# Patient Record
Sex: Male | Born: 1960 | Race: White | Hispanic: No | Marital: Married | Smoking: Current some day smoker
Health system: Southern US, Community
[De-identification: ages and names within clinical notes are randomized; demographics above are authoritative.]

## PROBLEM LIST (undated history)

## (undated) HISTORY — PX: HERNIA REPAIR: SHX51

## (undated) HISTORY — PX: WISDOM TOOTH EXTRACTION: SHX21

---

## 2013-06-06 HISTORY — PX: UMBILICAL HERNIA REPAIR: SHX196

## 2014-11-17 ENCOUNTER — Encounter: Payer: Self-pay | Admitting: Internal Medicine

## 2014-11-17 ENCOUNTER — Ambulatory Visit (INDEPENDENT_AMBULATORY_CARE_PROVIDER_SITE_OTHER): Payer: Federal, State, Local not specified - PPO | Admitting: Internal Medicine

## 2014-11-17 VITALS — BP 114/78 | HR 90 | Temp 98.6°F | Ht 67.33 in | Wt 185.0 lb

## 2014-11-17 DIAGNOSIS — Z79899 Other long term (current) drug therapy: Secondary | ICD-10-CM

## 2014-11-17 DIAGNOSIS — E785 Hyperlipidemia, unspecified: Secondary | ICD-10-CM | POA: Diagnosis not present

## 2014-11-17 DIAGNOSIS — Z0000 Encounter for general adult medical examination without abnormal findings: Secondary | ICD-10-CM

## 2014-11-17 DIAGNOSIS — Z125 Encounter for screening for malignant neoplasm of prostate: Secondary | ICD-10-CM

## 2014-11-17 DIAGNOSIS — Z1211 Encounter for screening for malignant neoplasm of colon: Secondary | ICD-10-CM

## 2014-11-17 NOTE — Patient Instructions (Signed)

## 2014-11-17 NOTE — Progress Notes (Signed)
HPI  Pt presents to the clinic today to establish care. He is transferring care from his PCP in [place]. His last annual exam was 3 years ago. He would like to get one today if he could.  Flu: never Tetanus: 2014 PSA Screening: never Colon Screening: never Vision Screening: as needed Dentist: biannually  Diet: He eats mostly whatever he wants to eat. Fruits and veggies. He does drink mostly water. Exercise:  He runs on the treadmill 3 days per week and goes for walks with his wife.  No past medical history on file.  No current outpatient prescriptions on file.   No current facility-administered medications for this visit.    No Known Allergies  Family History  Problem Relation Age of Onset  . Hyperlipidemia Father   . Heart disease Maternal Grandmother   . Heart disease Maternal Grandfather   . Heart disease Paternal Grandmother   . Hyperlipidemia Paternal Grandfather   . Heart disease Paternal Grandfather   . Stroke Paternal Grandfather   . Hypertension Paternal Grandfather   . Esophageal cancer Mother     History   Social History  . Marital Status: Married    Spouse Name: N/A  . Number of Children: N/A  . Years of Education: N/A   Occupational History  . Not on file.   Social History Main Topics  . Smoking status: Current Some Day Smoker    Types: Cigars  . Smokeless tobacco: Not on file  . Alcohol Use: 0.0 oz/week    0 Standard drinks or equivalent per week     Comment: socially  . Drug Use: Not on file  . Sexual Activity: Not on file   Other Topics Concern  . Not on file   Social History Narrative  . No narrative on file    ROS:  Constitutional: Denies fever, malaise, fatigue, headache or abrupt weight changes.  HEENT: Denies eye pain, eye redness, ear pain, ringing in the ears, wax buildup, runny nose, nasal congestion, bloody nose, or sore throat. Respiratory: Denies difficulty breathing, shortness of breath, cough or sputum production.    Cardiovascular: Denies chest pain, chest tightness, palpitations or swelling in the hands or feet.  Gastrointestinal: Denies abdominal pain, bloating, constipation, diarrhea or blood in the stool.  GU: Denies frequency, urgency, pain with urination, blood in urine, odor or discharge. Musculoskeletal: Denies decrease in range of motion, difficulty with gait, muscle pain or joint pain and swelling.  Skin: Denies redness, rashes, lesions or ulcercations.  Neurological: Denies dizziness, difficulty with memory, difficulty with speech or problems with balance and coordination.  Psych: Denies anxiety, depression, SI/HI.  No other specific complaints in a complete review of systems (except as listed in HPI above).  PE:  BP 114/78 mmHg  Pulse 90  Temp(Src) 98.6 F (37 C) (Oral)  Ht 5' 7.33" (1.71 m)  Wt 185 lb (83.915 kg)  BMI 28.70 kg/m2  SpO2 98% Wt Readings from Last 3 Encounters:  11/17/14 185 lb (83.915 kg)    General: Appears his stated age, well developed, well nourished in NAD. HEENT: Head: normal shape and size; Eyes: sclera white, no icterus, conjunctiva pink, PERRLA and EOMs intact; Ears: Tm's gray and intact, normal light reflex;  Throat/Mouth: Teeth present, mucosa pink and moist, no lesions or ulcerations noted.  Neck: Neck supple, trachea midline. No massses, lumps or thyromegaly present.  Cardiovascular: Normal rate and rhythm. S1,S2 noted.  No murmur, rubs or gallops noted. No JVD or BLE edema. No carotid  bruits noted. Pulmonary/Chest: Normal effort and positive vesicular breath sounds. No respiratory distress. No wheezes, rales or ronchi noted.  Abdomen: Soft and nontender. Normal bowel sounds, no bruits noted. No distention or masses noted. Liver, spleen and kidneys non palpable. Musculoskeletal: Normal range of motion. Strength 5/5 BUE/BLE. No difficulty with gait.  Neurological: Alert and oriented. Cranial nerves II-XII grossly intact. Coordination normal.  Psychiatric:  Mood and affect normal. Behavior is normal. Judgment and thought content normal.     Assessment and Plan:  Preventative Health Maintenance:  Encouraged him to get a flu shot in the fall Tetanus UTD Will check PSA in addition to CBC, CMET, Lipid profile ECG normal He declines colonoscopy but is agreeable to IFOB, ordered today Encouraged him to work on diet and exercise  RTC in 1 year or sooner if needed

## 2014-11-17 NOTE — Progress Notes (Signed)
Pre visit review using our clinic review tool, if applicable. No additional management support is needed unless otherwise documented below in the visit note. 

## 2014-11-18 LAB — COMPREHENSIVE METABOLIC PANEL
ALK PHOS: 66 U/L (ref 39–117)
ALT: 20 U/L (ref 0–53)
AST: 22 U/L (ref 0–37)
Albumin: 4.4 g/dL (ref 3.5–5.2)
BUN: 25 mg/dL — ABNORMAL HIGH (ref 6–23)
CHLORIDE: 101 meq/L (ref 96–112)
CO2: 32 meq/L (ref 19–32)
Calcium: 9.5 mg/dL (ref 8.4–10.5)
Creatinine, Ser: 1.21 mg/dL (ref 0.40–1.50)
GFR: 66.48 mL/min (ref >60.00)
Glucose, Bld: 101 mg/dL — ABNORMAL HIGH (ref 70–99)
Potassium: 4.4 mEq/L (ref 3.5–5.1)
SODIUM: 140 meq/L (ref 135–145)
TOTAL PROTEIN: 6.9 g/dL (ref 6.0–8.3)
Total Bilirubin: 0.5 mg/dL (ref 0.2–1.2)

## 2014-11-18 LAB — LIPID PANEL
Cholesterol: 256 mg/dL — ABNORMAL HIGH (ref 0–200)
HDL: 46 mg/dL (ref >39.00)
NonHDL: 210
Total CHOL/HDL Ratio: 6
Triglycerides: 396 mg/dL — ABNORMAL HIGH (ref 0.0–149.0)
VLDL: 79.2 mg/dL — AB (ref 0.0–40.0)

## 2014-11-18 LAB — CBC
HCT: 44.3 % (ref 39.0–52.0)
HEMOGLOBIN: 15.1 g/dL (ref 13.0–17.0)
MCHC: 34 g/dL (ref 30.0–36.0)
MCV: 87.9 fl (ref 78.0–100.0)
PLATELETS: 303 10*3/uL (ref 150.0–400.0)
RBC: 5.04 Mil/uL (ref 4.22–5.81)
RDW: 13.6 % (ref 11.5–15.5)
WBC: 9.2 10*3/uL (ref 4.0–10.5)

## 2014-11-18 LAB — PSA: PSA: 1.56 ng/mL (ref 0.10–4.00)

## 2014-11-18 LAB — LDL CHOLESTEROL, DIRECT: Direct LDL: 182 mg/dL

## 2014-11-18 MED ORDER — SIMVASTATIN 20 MG PO TABS: 20.0000 mg | ORAL_TABLET | Freq: Every day | ORAL | Status: DC

## 2014-11-18 NOTE — Addendum Note (Signed)
Addended by: Littie DeedsEVONTENNO, Phineas Mcenroe Y on: 11/18/2014 05:00 PM   Modules accepted: Orders

## 2015-06-09 LAB — HEPATIC FUNCTION PANEL
ALK PHOS: 68 U/L (ref 25–125)
ALT: 31 U/L (ref 10–40)
AST: 29 U/L (ref 14–40)

## 2015-06-09 LAB — LIPID PANEL
Cholesterol: 236 mg/dL — AB (ref 0–200)
HDL: 60 mg/dL (ref 35–70)
LDL CALC: 125 mg/dL
Triglycerides: 252 mg/dL — AB (ref 40–160)

## 2015-06-09 LAB — HEMOGLOBIN A1C: HEMOGLOBIN A1C: 6.1

## 2015-06-26 ENCOUNTER — Ambulatory Visit (INDEPENDENT_AMBULATORY_CARE_PROVIDER_SITE_OTHER): Payer: Federal, State, Local not specified - PPO | Admitting: Internal Medicine

## 2015-06-26 ENCOUNTER — Encounter: Payer: Self-pay | Admitting: Internal Medicine

## 2015-06-26 VITALS — BP 118/70 | HR 76 | Temp 98.2°F | Wt 192.0 lb

## 2015-06-26 DIAGNOSIS — R7303 Prediabetes: Secondary | ICD-10-CM | POA: Diagnosis not present

## 2015-06-26 DIAGNOSIS — E785 Hyperlipidemia, unspecified: Secondary | ICD-10-CM | POA: Diagnosis not present

## 2015-06-26 NOTE — Assessment & Plan Note (Signed)
Now on Paleo diet and walking 2.5 miles today Discussed how low carb diet and exercise reduces blood sugar levels I do not think he needs to be started on medication at this time Will check A1C around 09/04/15

## 2015-06-26 NOTE — Assessment & Plan Note (Signed)
Encouraged him to consume a low fat diet Continue Zocor at this time Will recheck Lipid Profile and CMET around 09/04/15

## 2015-06-26 NOTE — Progress Notes (Signed)
Subjective:    Patient ID: Justin Alvarado, male    DOB: 1961-02-25, 55 y.o.   MRN: [id number]  HPI  Pt presents to the clinic today to discuss labs that he had done by his insurance carrier. He reports he had an A1C of 6.1% from 06/09/15. Since he got those results, he has started on a low carb diet and walking 2.5 miles per day. He has no family history of diabetes and has never been told that he is prediabetic. He denies blurred vision, dizziness, increased thirst, frequent urination, or numbness/tingling in his hands and feet.  He is also past due for follow up of HLD. His last LDL was 182, Triglycerides 396 (11/2014). He was started on Zocor and advised to follow a strict low fat, low cholesterol diet. He reports he only started the Zocor 6 weeks ago. He has been working on a low fat diet.  Review of Systems  No past medical history on file.  Current Outpatient Prescriptions  Medication Sig Dispense Refill  . simvastatin (ZOCOR) 20 MG tablet Take 1 tablet (20 mg total) by mouth at bedtime. 30 tablet 2   No current facility-administered medications for this visit.    No Known Allergies  Family History  Problem Relation Age of Onset  . Hyperlipidemia Father   . Heart disease Maternal Grandmother   . Heart disease Maternal Grandfather   . Heart disease Paternal Grandmother   . Hyperlipidemia Paternal Grandfather   . Heart disease Paternal Grandfather   . Stroke Paternal Grandfather   . Hypertension Paternal Grandfather   . Esophageal cancer Mother     Social History   Social History  . Marital Status: Married    Spouse Name: N/A  . Number of Children: N/A  . Years of Education: N/A   Occupational History  . Not on file.   Social History Main Topics  . Smoking status: Current Some Day Smoker    Types: Cigars  . Smokeless tobacco: Not on file  . Alcohol Use: 0.0 oz/week    0 Standard drinks or equivalent per week     Comment: socially  . Drug Use: No  . Sexual  Activity: Yes   Other Topics Concern  . Not on file   Social History Narrative     Constitutional: Denies fever, malaise, fatigue, headache or abrupt weight changes.  HEENT: Denies eye pain, eye redness, ear pain, ringing in the ears, wax buildup, runny nose, nasal congestion, bloody nose, or sore throat. Respiratory: Denies difficulty breathing, shortness of breath, cough or sputum production.   Cardiovascular: Denies chest pain, chest tightness, palpitations or swelling in the hands or feet.  Gastrointestinal: Denies abdominal pain, bloating, constipation, diarrhea or blood in the stool.  GU: Denies urgency, frequency, pain with urination, burning sensation, blood in urine, odor or discharge. Musculoskeletal: Denies decrease in range of motion, difficulty with gait, muscle pain or joint pain and swelling.  Skin: Denies redness, rashes, lesions or ulcercations.  Neurological: Denies dizziness, difficulty with memory, difficulty with speech or problems with balance and coordination.  Psych: Denies anxiety, depression, SI/HI.  No other specific complaints in a complete review of systems (except as listed in HPI above).     Objective:   Physical Exam  BP 118/70 mmHg  Pulse 76  Temp(Src) 98.2 F (36.8 C) (Oral)  Wt 192 lb (87.091 kg)  SpO2 98% Wt Readings from Last 3 Encounters:  06/26/15 192 lb (87.091 kg)  11/17/14 185 lb (83.915 kg)  General: Appears his stated age, well developed, well nourished in NAD. Skin: Warm, dry and intact. No rashes, lesions or ulcerations noted. Cardiovascular: Normal rate and rhythm. S1,S2 noted.  No murmur, rubs or gallops noted. No JVD or BLE edema. No carotid bruits noted. Pulmonary/Chest: Normal effort and positive vesicular breath sounds. No respiratory distress. No wheezes, rales or ronchi noted.  Neurological: Alert and oriented.    BMET    Component Value Date/Time   NA 140 11/17/2014 1606   K 4.4 11/17/2014 1606   CL 101  11/17/2014 1606   CO2 32 11/17/2014 1606   GLUCOSE 101* 11/17/2014 1606   BUN 25* 11/17/2014 1606   CREATININE 1.21 11/17/2014 1606   CALCIUM 9.5 11/17/2014 1606    Lipid Panel     Component Value Date/Time   CHOL 256* 11/17/2014 1606   TRIG 396.0* 11/17/2014 1606   HDL 46.00 11/17/2014 1606   CHOLHDL 6 11/17/2014 1606   VLDL 79.2* 11/17/2014 1606    CBC    Component Value Date/Time   WBC 9.2 11/17/2014 1606   RBC 5.04 11/17/2014 1606   HGB 15.1 11/17/2014 1606   HCT 44.3 11/17/2014 1606   PLT 303.0 11/17/2014 1606   MCV 87.9 11/17/2014 1606   MCHC 34.0 11/17/2014 1606   RDW 13.6 11/17/2014 1606    Hgb A1C No results found for: HGBA1C       Assessment & Plan:

## 2015-06-26 NOTE — Progress Notes (Signed)
Pre visit review using our clinic review tool, if applicable. No additional management support is needed unless otherwise documented below in the visit note. 

## 2015-06-26 NOTE — Patient Instructions (Signed)

## 2015-09-04 ENCOUNTER — Other Ambulatory Visit: Payer: Federal, State, Local not specified - PPO

## 2015-09-06 ENCOUNTER — Encounter: Payer: Self-pay | Admitting: Internal Medicine

## 2015-09-08 ENCOUNTER — Other Ambulatory Visit (INDEPENDENT_AMBULATORY_CARE_PROVIDER_SITE_OTHER): Payer: Federal, State, Local not specified - PPO

## 2015-09-08 DIAGNOSIS — R7303 Prediabetes: Secondary | ICD-10-CM

## 2015-09-08 DIAGNOSIS — E785 Hyperlipidemia, unspecified: Secondary | ICD-10-CM | POA: Diagnosis not present

## 2015-09-08 DIAGNOSIS — Z79899 Other long term (current) drug therapy: Secondary | ICD-10-CM | POA: Diagnosis not present

## 2015-09-08 LAB — COMPREHENSIVE METABOLIC PANEL
ALBUMIN: 4.7 g/dL (ref 3.5–5.2)
ALT: 29 U/L (ref 0–53)
AST: 53 U/L — AB (ref 0–37)
Alkaline Phosphatase: 60 U/L (ref 39–117)
BUN: 20 mg/dL (ref 6–23)
CALCIUM: 9.9 mg/dL (ref 8.4–10.5)
CHLORIDE: 99 meq/L (ref 96–112)
CO2: 30 meq/L (ref 19–32)
Creatinine, Ser: 1.2 mg/dL (ref 0.40–1.50)
GFR: 66.92 mL/min (ref >60.00)
Glucose, Bld: 98 mg/dL (ref 70–99)
Potassium: 4.3 mEq/L (ref 3.5–5.1)
Sodium: 134 mEq/L — ABNORMAL LOW (ref 135–145)
Total Bilirubin: 0.8 mg/dL (ref 0.2–1.2)
Total Protein: 7.6 g/dL (ref 6.0–8.3)

## 2015-09-08 LAB — LIPID PANEL
CHOL/HDL RATIO: 6
CHOLESTEROL: 252 mg/dL — AB (ref 0–200)
HDL: 45.8 mg/dL (ref >39.00)
LDL CALC: 185 mg/dL — AB (ref 0–99)
NonHDL: 206.36
TRIGLYCERIDES: 108 mg/dL (ref 0.0–149.0)
VLDL: 21.6 mg/dL (ref 0.0–40.0)

## 2015-09-08 LAB — HEMOGLOBIN A1C: HEMOGLOBIN A1C: 6.1 % (ref 4.6–6.5)

## 2015-09-13 MED ORDER — SIMVASTATIN 40 MG PO TABS: 40.0000 mg | ORAL_TABLET | Freq: Every day | ORAL | Status: DC

## 2015-09-13 NOTE — Addendum Note (Signed)
Addended by: Roena MaladyEVONTENNO, MELANIE Y on: 09/13/2015 12:18 PM   Modules accepted: Orders, Medications

## 2016-08-27 ENCOUNTER — Ambulatory Visit: Payer: Federal, State, Local not specified - PPO | Admitting: Internal Medicine

## 2016-09-02 ENCOUNTER — Encounter: Payer: Self-pay | Admitting: Internal Medicine

## 2016-09-02 ENCOUNTER — Ambulatory Visit (INDEPENDENT_AMBULATORY_CARE_PROVIDER_SITE_OTHER): Payer: Federal, State, Local not specified - PPO | Admitting: Internal Medicine

## 2016-09-02 VITALS — BP 120/80 | HR 84 | Temp 98.6°F | Wt 188.0 lb

## 2016-09-02 DIAGNOSIS — J069 Acute upper respiratory infection, unspecified: Secondary | ICD-10-CM

## 2016-09-02 MED ORDER — HYDROCODONE-HOMATROPINE 5-1.5 MG/5ML PO SYRP: 5.0000 mL | ORAL_SOLUTION | Freq: Three times a day (TID) | ORAL | 0 refills | Status: DC | PRN

## 2016-09-02 NOTE — Patient Instructions (Signed)
Cough, Adult  A cough helps to clear your throat and lungs. A cough may last only 2?3 weeks (acute), or it may last longer than 8 weeks (chronic). Many different things can cause a cough. A cough may be a sign of an illness or another medical condition.  Follow these instructions at home:  ? Pay attention to any changes in your cough.  ? Take medicines only as told by your doctor.  ? If you were prescribed an antibiotic medicine, take it as told by your doctor. Do not stop taking it even if you start to feel better.  ? Talk with your doctor before you try using a cough medicine.  ? Drink enough fluid to keep your pee (urine) clear or pale yellow.  ? If the air is dry, use a cold steam vaporizer or humidifier in your home.  ? Stay away from things that make you cough at work or at home.  ? If your cough is worse at night, try using extra pillows to raise your head up higher while you sleep.  ? Do not smoke, and try not to be around smoke. If you need help quitting, ask your doctor.  ? Do not have caffeine.  ? Do not drink alcohol.  ? Rest as needed.  Contact a doctor if:  ? You have new problems (symptoms).  ? You cough up yellow fluid (pus).  ? Your cough does not get better after 2?3 weeks, or your cough gets worse.  ? Medicine does not help your cough and you are not sleeping well.  ? You have pain that gets worse or pain that is not helped with medicine.  ? You have a fever.  ? You are losing weight and you do not know why.  ? You have night sweats.  Get help right away if:  ? You cough up blood.  ? You have trouble breathing.  ? Your heartbeat is very fast.  This information is not intended to replace advice given to you by your health care provider. Make sure you discuss any questions you have with your health care provider.  Document Released: 01/03/2011 Document Revised: 09/28/2015 Document Reviewed: 06/29/2014  Elsevier Interactive Patient Education ? 2017 [employer]

## 2016-09-02 NOTE — Progress Notes (Signed)
Subjective:    Patient ID: Justin Alvarado, male    DOB: 07-18-1960, 56 y.o.   MRN: [id number]  HPI  Pt presents to the clinic today for UC followup. This started 1 week ago. He went to [facility] on 08/26/2016. He was diagnosed with a URI and given Azithromycin, Albuterol, Tessalon and Flnase. At this point, he has finished the Azithromycin. He continues to have runny nose, nasal congestion, cough. He is blowing clear mucous out of his nose. The cough is nonproductive. He denies fever, chills or body aches. He has not taken anything other than what he was prescribed. He has had sick contacts.   Review of Systems  No past medical history on file.  Current Outpatient Prescriptions  Medication Sig Dispense Refill  . simvastatin (ZOCOR) 40 MG tablet Take 1 tablet (40 mg total) by mouth at bedtime. 30 tablet 5   No current facility-administered medications for this visit.     No Known Allergies  Family History  Problem Relation Age of Onset  . Hyperlipidemia Father   . Heart disease Maternal Grandmother   . Heart disease Maternal Grandfather   . Heart disease Paternal Grandmother   . Hyperlipidemia Paternal Grandfather   . Heart disease Paternal Grandfather   . Stroke Paternal Grandfather   . Hypertension Paternal Grandfather   . Esophageal cancer Mother     Social History   Social History  . Marital status: Married    Spouse name: N/A  . Number of children: N/A  . Years of education: N/A   Occupational History  . Not on file.   Social History Main Topics  . Smoking status: Current Some Day Smoker    Types: Cigars  . Smokeless tobacco: Never Used  . Alcohol use 0.0 oz/week     Comment: socially  . Drug use: No  . Sexual activity: Yes   Other Topics Concern  . Not on file   Social History Narrative  . No narrative on file     Constitutional: Denies fever, malaise, fatigue, headache or abrupt weight changes.  HEENT: Pt reports runny nose, nasal  congestion. Denies eye pain, eye redness, ear pain, ringing in the ears, wax buildup, bloody nose, or sore throat. Respiratory: Pt reports cough. Denies difficulty breathing, shortness of breath, or sputum production.   Cardiovascular: Denies chest pain, chest tightness, palpitations or swelling in the hands or feet.   No other specific complaints in a complete review of systems (except as listed in HPI above).     Objective:   Physical Exam  BP 120/80   Pulse 84   Temp 98.6 F (37 C) (Oral)   Wt 188 lb (85.3 kg)   SpO2 97%   BMI 29.16 kg/m  Wt Readings from Last 3 Encounters:  09/02/16 188 lb (85.3 kg)  06/26/15 192 lb (87.1 kg)  11/17/14 185 lb (83.9 kg)    General: Appears his stated age, well developed, well nourished in NAD. HEENT: Head: normal shape and size, no sinus tenderness noted;  Ears: Tm's gray and intact, normal light reflex; Nose: mucosa pink and moist, septum midline; Throat/Mouth: Teeth present, mucosa pink and moist, +PND, no exudate, lesions or ulcerations noted.  Neck:  No adenopathy noted. Cardiovascular: Normal rate and rhythm.  Pulmonary/Chest: Normal effort and positive vesicular breath sounds. No respiratory distress. No wheezes, rales or ronchi noted.    BMET    Component Value Date/Time   NA 134 (L) 09/08/2015 0813   K  4.3 09/08/2015 0813   CL 99 09/08/2015 0813   CO2 30 09/08/2015 0813   GLUCOSE 98 09/08/2015 0813   BUN 20 09/08/2015 0813   CREATININE 1.20 09/08/2015 0813   CALCIUM 9.9 09/08/2015 0813    Lipid Panel     Component Value Date/Time   CHOL 252 (H) 09/08/2015 0813   TRIG 108.0 09/08/2015 0813   HDL 45.80 09/08/2015 0813   CHOLHDL 6 09/08/2015 0813   VLDL 21.6 09/08/2015 0813   LDLCALC 185 (H) 09/08/2015 0813    CBC    Component Value Date/Time   WBC 9.2 11/17/2014 1606   RBC 5.04 11/17/2014 1606   HGB 15.1 11/17/2014 1606   HCT 44.3 11/17/2014 1606   PLT 303.0 11/17/2014 1606   MCV 87.9 11/17/2014 1606   MCHC  34.0 11/17/2014 1606   RDW 13.6 11/17/2014 1606    Hgb A1C Lab Results  Component Value Date   HGBA1C 6.1 09/08/2015            Assessment & Plan:   Cough:  No indication for repeat abx Keep your mouth moist, drink plenty of fluids RX for Hycodan for cough  Return precautions discussed Nicki Reaper, NP

## 2017-05-29 ENCOUNTER — Observation Stay: Payer: Federal, State, Local not specified - PPO

## 2017-05-29 ENCOUNTER — Emergency Department: Payer: Federal, State, Local not specified - PPO

## 2017-05-29 ENCOUNTER — Observation Stay
Admission: EM | Admit: 2017-05-29 | Discharge: 2017-05-30 | Disposition: A | Discharge status: Home / Self Care | Payer: Federal, State, Local not specified - PPO | Attending: Internal Medicine | Admitting: Internal Medicine

## 2017-05-29 ENCOUNTER — Encounter: Payer: Self-pay | Admitting: Emergency Medicine

## 2017-05-29 ENCOUNTER — Other Ambulatory Visit: Payer: Self-pay

## 2017-05-29 ENCOUNTER — Observation Stay (HOSPITAL_BASED_OUTPATIENT_CLINIC_OR_DEPARTMENT_OTHER)
Admit: 2017-05-29 | Discharge: 2017-05-29 | Disposition: A | Discharge status: Home / Self Care | Payer: Federal, State, Local not specified - PPO | Attending: Internal Medicine | Admitting: Internal Medicine

## 2017-05-29 DIAGNOSIS — F17290 Nicotine dependence, other tobacco product, uncomplicated: Secondary | ICD-10-CM | POA: Diagnosis not present

## 2017-05-29 DIAGNOSIS — Z9114 Patient's other noncompliance with medication regimen: Secondary | ICD-10-CM | POA: Insufficient documentation

## 2017-05-29 DIAGNOSIS — E785 Hyperlipidemia, unspecified: Secondary | ICD-10-CM | POA: Insufficient documentation

## 2017-05-29 DIAGNOSIS — G459 Transient cerebral ischemic attack, unspecified: Principal | ICD-10-CM | POA: Insufficient documentation

## 2017-05-29 DIAGNOSIS — Z79899 Other long term (current) drug therapy: Secondary | ICD-10-CM | POA: Insufficient documentation

## 2017-05-29 DIAGNOSIS — I5030 Unspecified diastolic (congestive) heart failure: Secondary | ICD-10-CM | POA: Diagnosis not present

## 2017-05-29 LAB — CBC
HCT: 49 % (ref 40.0–52.0)
Hemoglobin: 16.7 g/dL (ref 13.0–18.0)
MCH: 29.6 pg (ref 26.0–34.0)
MCHC: 34 g/dL (ref 32.0–36.0)
MCV: 87.1 fL (ref 80.0–100.0)
PLATELETS: 320 10*3/uL (ref 150–440)
RBC: 5.63 MIL/uL (ref 4.40–5.90)
RDW: 13.3 % (ref 11.5–14.5)
WBC: 6 10*3/uL (ref 3.8–10.6)

## 2017-05-29 LAB — APTT: aPTT: 38 seconds — ABNORMAL HIGH (ref 24–36)

## 2017-05-29 LAB — LIPID PANEL
CHOL/HDL RATIO: 5.2 ratio
Cholesterol: 306 mg/dL — ABNORMAL HIGH (ref 0–200)
HDL: 59 mg/dL (ref >40)
LDL CALC: 221 mg/dL — AB (ref 0–99)
Triglycerides: 129 mg/dL (ref <150)
VLDL: 26 mg/dL (ref 0–40)

## 2017-05-29 LAB — DIFFERENTIAL
Basophils Absolute: 0.1 10*3/uL (ref 0–0.1)
Basophils Relative: 1 %
Eosinophils Absolute: 0 10*3/uL (ref 0–0.7)
Eosinophils Relative: 1 %
LYMPHS PCT: 28 %
Lymphs Abs: 1.7 10*3/uL (ref 1.0–3.6)
MONO ABS: 0.5 10*3/uL (ref 0.2–1.0)
Monocytes Relative: 9 %
NEUTROS ABS: 3.7 10*3/uL (ref 1.4–6.5)
Neutrophils Relative %: 61 %

## 2017-05-29 LAB — PROTIME-INR
INR: 0.92
PROTHROMBIN TIME: 12.3 s (ref 11.4–15.2)

## 2017-05-29 LAB — ECHOCARDIOGRAM COMPLETE
Height: 69 in
Weight: 3097 oz

## 2017-05-29 LAB — COMPREHENSIVE METABOLIC PANEL
ALBUMIN: 4.7 g/dL (ref 3.5–5.0)
ALK PHOS: 65 U/L (ref 38–126)
ALT: 30 U/L (ref 17–63)
AST: 35 U/L (ref 15–41)
Anion gap: 8 (ref 5–15)
BUN: 17 mg/dL (ref 6–20)
CALCIUM: 9.5 mg/dL (ref 8.9–10.3)
CO2: 25 mmol/L (ref 22–32)
CREATININE: 1.05 mg/dL (ref 0.61–1.24)
Chloride: 102 mmol/L (ref 101–111)
GFR calc non Af Amer: >60 mL/min (ref >60)
GLUCOSE: 127 mg/dL — AB (ref 65–99)
Potassium: 3.9 mmol/L (ref 3.5–5.1)
SODIUM: 135 mmol/L (ref 135–145)
Total Bilirubin: 0.8 mg/dL (ref 0.3–1.2)
Total Protein: 8.3 g/dL — ABNORMAL HIGH (ref 6.5–8.1)

## 2017-05-29 LAB — TROPONIN I: Troponin I: <0.03 ng/mL (ref <0.03)

## 2017-05-29 MED ORDER — ASPIRIN 300 MG RE SUPP: 300.0000 mg | Freq: Every day | RECTAL | Status: DC

## 2017-05-29 MED ORDER — ACETAMINOPHEN 325 MG PO TABS
650.0000 mg | ORAL_TABLET | ORAL | Status: DC | PRN
  Administered 2017-05-30: 08:00:00 650 mg via ORAL
  Filled 2017-05-29: qty 2

## 2017-05-29 MED ORDER — ENOXAPARIN SODIUM 40 MG/0.4ML ~~LOC~~ SOLN
40.0000 mg | SUBCUTANEOUS | Status: DC
Start: 2017-05-29 — End: 2017-05-30
  Administered 2017-05-29: 22:00:00 40 mg via SUBCUTANEOUS
  Filled 2017-05-29: qty 0.4

## 2017-05-29 MED ORDER — ASPIRIN 81 MG PO CHEW
324.0000 mg | CHEWABLE_TABLET | Freq: Once | ORAL | Status: AC
  Administered 2017-05-29: 324 mg via ORAL
  Filled 2017-05-29: qty 4

## 2017-05-29 MED ORDER — STROKE: EARLY STAGES OF RECOVERY BOOK
Freq: Once | Status: AC
  Administered 2017-05-29: 13:00:00

## 2017-05-29 MED ORDER — ATORVASTATIN CALCIUM 20 MG PO TABS
40.0000 mg | ORAL_TABLET | Freq: Every day | ORAL | Status: DC
  Administered 2017-05-29: 40 mg via ORAL
  Filled 2017-05-29: qty 2

## 2017-05-29 MED ORDER — ACETAMINOPHEN 160 MG/5ML PO SOLN
650.0000 mg | ORAL | Status: DC | PRN
  Filled 2017-05-29: qty 20.3

## 2017-05-29 MED ORDER — ACETAMINOPHEN 650 MG RE SUPP: 650.0000 mg | RECTAL | Status: DC | PRN

## 2017-05-29 MED ORDER — ASPIRIN 325 MG PO TABS
325.0000 mg | ORAL_TABLET | Freq: Every day | ORAL | Status: DC
  Filled 2017-05-29: qty 1

## 2017-05-29 MED ORDER — SODIUM CHLORIDE 0.9 % IV SOLN
INTRAVENOUS | Status: DC
  Administered 2017-05-29 – 2017-05-30 (×2): via INTRAVENOUS

## 2017-05-29 NOTE — ED Notes (Signed)
Right arm numbness and tingling that began 25 minutes ago, pt said he began having slurred speech also that has resolved at this time.

## 2017-05-29 NOTE — Progress Notes (Signed)
OT Cancellation Note  Patient Details Name: Justin Alvarado MRN: [id number] DOB: 1961/03/02   Cancelled Treatment:    Reason Eval/Treat Not Completed: Patient at procedure or test/ unavailable. Order received, chart reviewed. Pt out of room for testing. Will re-attempt OT evaluation at later date/time as pt is available and medically appropriate.  Richrd PrimeJamie Stiller, MPH, MS, OTR/L ascom [phone] 05/29/17, 11:36 AM

## 2017-05-29 NOTE — ED Notes (Signed)
Family at bedside. 

## 2017-05-29 NOTE — Progress Notes (Signed)
OT Cancellation Note  Patient Details Name: Justin Alvarado MRN: [id number] DOB: [birth year]   Cancelled Treatment:    Reason Eval/Treat Not Completed: OT screened, no needs identified, will sign off. Order received, chart reviewed. Met with pt, spouse present. Pt verbalizes "feeling back to normal" with no complaints and eager to learn from the doctor what caused his previous symptoms. 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 Pt screened by OT and does not present with any physical or cognitive deficits at this time and is at baseline independence with ADL and mobility tasks. No skilled OT needs at this time. Will sign off. Please re-consult if there is a change in pt status as medically appropriate.   Jeni Salles, MPH, MS, OTR/L ascom [phone] 05/29/17, 3:07 PM

## 2017-05-29 NOTE — Progress Notes (Signed)
CH consulted with patient to follow-up per request of CH on call. Patient was with medical team member preparing to walk around the unit. Patient stated that he did not know why he was here, he is still waiting to get results. CH provided emotional support, empathetic listening, and silent prayer. CH left upon patient getting a phone call.

## 2017-05-29 NOTE — ED Provider Notes (Signed)
[facility] Emergency Department Provider Note   ____________________________________________   First MD Initiated Contact with Patient 05/29/17 [id number]     (approximate)  I have reviewed the triage vital signs and the nursing notes.   HISTORY  Chief Complaint Code Stroke; Numbness; and Aphasia    HPI Justin Alvarado is a 57 y.o. male reports no major medical history except for high cholesterol Patient was in the bathroom when he began and noticed that his right hand felt tingly.  He then dropped the toothbrush, began developing weakness in the right hand.  Wife reports that he came out of the bathroom looked pale and his speech was abnormal.  He wanted to sit for a little bit, they waited a few minutes but then drove to the hospital.  He reports no history of any significant medical disease except for high cholesterol.  Wife reports she does not believe he is taking any medications presently.  Patient in no pain.  No nausea or vomiting.  No headache.  Symptom onset was abrupt, wife estimates started about 830 this morning.  No chest pain.  No trouble breathing.  All of his symptoms were relieved about the same time he went to his CT scan.  He is now able to talk well, the numbness and weakness in his right arm is gone away.  He feels much better.  Denies any ongoing numbness tingling weakness trouble speaking or other symptoms at this time  History reviewed. No pertinent past medical history.  Patient Active Problem List   Diagnosis Date Noted  . TIA (transient ischemic attack) 05/29/2017  . HLD (hyperlipidemia) 06/26/2015  . Prediabetes 06/26/2015    Past Surgical History:  Procedure Laterality Date  . HERNIA REPAIR    . UMBILICAL HERNIA REPAIR  06/2013  . WISDOM TOOTH EXTRACTION      Prior to Admission medications   Medication Sig Start Date End Date Taking? Authorizing Provider  HYDROcodone-homatropine (HYCODAN) 5-1.5 MG/5ML syrup Take 5 mLs by  mouth every 8 (eight) hours as needed for cough. Patient not taking: Reported on 05/29/2017 09/02/16   Lorre Munroe, NP  simvastatin (ZOCOR) 40 MG tablet Take 1 tablet (40 mg total) by mouth at bedtime. Patient not taking: Reported on 05/29/2017 09/13/15   Lorre Munroe, NP    Allergies Patient has no known allergies.  Family History  Problem Relation Age of Onset  . Hyperlipidemia Father   . Esophageal cancer Mother   . Heart disease Maternal Grandmother   . Heart disease Maternal Grandfather   . Heart disease Paternal Grandmother   . Hyperlipidemia Paternal Grandfather   . Heart disease Paternal Grandfather   . Stroke Paternal Grandfather   . Hypertension Paternal Grandfather     Social History Social History   Tobacco Use  . Smoking status: Current Some Day Smoker    Types: Cigars  . Smokeless tobacco: Never Used  Substance Use Topics  . Alcohol use: Yes    Alcohol/week: 0.0 oz    Comment: socially  . Drug use: No    Review of Systems Constitutional: No fever/chills Eyes: No visual changes. ENT: No sore throat.  No neck pain. Cardiovascular: Denies chest pain. Respiratory: Denies shortness of breath. Gastrointestinal: No abdominal pain.  No nausea, no vomiting.  No diarrhea.  No constipation. Genitourinary: Negative for dysuria. Musculoskeletal: Negative for back pain. Skin: Negative for rash. Neurological: Negative for headaches.    ____________________________________________   PHYSICAL EXAM:  VITAL SIGNS: ED  Triage Vitals [05/29/17 0908]  Enc Vitals Group     BP (!) 159/90     Pulse Rate 92     Resp 20     Temp 98.9 F (37.2 C)     Temp Source Oral     SpO2 97 %     Weight 190 lb (86.2 kg)     Height      Head Circumference      Peak Flow      Pain Score      Pain Loc      Pain Edu?      Excl. in GC?     Constitutional: Alert and oriented. Well appearing and in no acute distress. Eyes: Conjunctivae are normal. Head:  Atraumatic. Nose: No congestion/rhinnorhea. Mouth/Throat: Mucous membranes are moist. Neck: No stridor.   Cardiovascular: Normal rate, regular rhythm. Grossly normal heart sounds.  Good peripheral circulation. Respiratory: Normal respiratory effort.  No retractions. Lungs CTAB. Gastrointestinal: Soft and nontender. No distention. Musculoskeletal: No lower extremity tenderness nor edema. Neurologic:  Normal speech and language. No gross focal neurologic deficits are appreciated.   NIH score equals 0, performed by me at bedside. The patient has no pronator drift. The patient has normal cranial nerve exam. Extraocular movements are normal. Visual fields are normal. Patient has 5 out of 5 strength in all extremities. There is no numbness or gross, acute sensory abnormality in the extremities bilaterally. No speech disturbance. No dysarthria. No aphasia. No ataxia. Normal finger nose finger bilat. Patient speaking in full and clear sentences.   Skin:  Skin is warm, dry and intact. No rash noted. Psychiatric: Mood and affect are normal. Speech and behavior are normal.  ____________________________________________   LABS (all labs ordered are listed, but only abnormal results are displayed)  Labs Reviewed  APTT - Abnormal; Notable for the following components:      Result Value   aPTT 38 (*)    All other components within normal limits  COMPREHENSIVE METABOLIC PANEL - Abnormal; Notable for the following components:   Glucose, Bld 127 (*)    Total Protein 8.3 (*)    All other components within normal limits  LIPID PANEL - Abnormal; Notable for the following components:   Cholesterol 306 (*)    LDL Cholesterol 221 (*)    All other components within normal limits  PROTIME-INR  CBC  DIFFERENTIAL  TROPONIN I  HIV ANTIBODY (ROUTINE TESTING)  CBG MONITORING, ED   ____________________________________________  EKG  Reviewed and are by me at 9:30 AM Heart rate 95 Cures  100 QTC 430 Normal sinus rhythm, some slight artifact, no evidence of ischemia or ectopy ____________________________________________  RADIOLOGY  CT of the brain reviewed by me, no acute hemorrhage  ____________________________________________   PROCEDURES  Procedure(s) performed: None  Procedures  Critical Care performed: Yes, see critical care note(s)  CRITICAL CARE Performed by: Sharyn Creamer   Total critical care time: 38 minutes  Critical care time was exclusive of separately billable procedures and treating other patients.  Critical care was necessary to treat or prevent imminent or life-threatening deterioration.  Critical care was time spent personally by me on the following activities: development of treatment plan with patient and/or surrogate as well as nursing, discussions with consultants, evaluation of patient's response to treatment, examination of patient, obtaining history from patient or surrogate, ordering and performing treatments and interventions, ordering and review of laboratory studies, ordering and review of radiographic studies, pulse oximetry and re-evaluation of patient's condition.  Acute code stroke, patient presented with acute neurologic symptoms.  ____________________________________________   INITIAL IMPRESSION / ASSESSMENT AND PLAN / ED COURSE  Pertinent labs & imaging results that were available during my care of the patient were reviewed by me and considered in my medical decision making (see chart for details).  Saw and evaluated the patient and soon as he arrived to the room.  His symptoms are now resolved, and his NIH score is 0.  However his symptoms proceeding sound very concerning for an acute neurologic event.  Code stroke was activated, Dr. neurologist evaluated the patient in the ER in conjunction with me.  Clinical Course as of May 30 1647  Thu May 29, 2017  0919 Awaiting patient arrival to room from CT.   [MQ]  [phone] Patient  with patient reported that he had a brief episode of tingling on his left side now, it is again resolved.  Currently awake alert with no deficits.  Being admitted to the hospitalist service.  Cannot really well explained why felt some to the left side at this time, but they are now resolved and is being admitted for further neuro workup.  Continue to monitor regular  [MQ]    Clinical Course User Index [MQ] Sharyn CreamerQuale, Vonya Ohalloran, MD   ----------------------------------------- 9:33 AM on 05/29/2017 ----------------------------------------- Spoke with Dr. Thad Rangereynolds, she advises patient is not a TPA candidate as symptoms resolved.  I would agree.  We will continue to monitor him closely in the emergency room, at this point Dr. Thad Rangereynolds recommends giving oral aspirin and admission to hospital.  I am in agreement.  No cardiopulmonary symptoms.  Hemodynamics are stable.  No signs or symptoms of aneurysm.  No symptoms of seizure.  Blood glucose normal.  ----------------------------------------- 9:38 AM on 05/29/2017 -----------------------------------------  Labs pending, but discussed with Dr. waiting the hospitalist, will admit the patient for further evaluation for what appears primary impression to be TIA at this time.   ____________________________________________   FINAL CLINICAL IMPRESSION(S) / ED DIAGNOSES  Final diagnoses:  TIA (transient ischemic attack)      NEW MEDICATIONS STARTED DURING THIS VISIT:  Current Discharge Medication List       Note:  This document was prepared using Dragon voice recognition software and may include unintentional dictation errors.     Sharyn CreamerQuale, Excell Neyland, MD 05/29/17 1650

## 2017-05-29 NOTE — H&P (Signed)
[facility] [place] [facility]   PATIENT NAME: Justin Alvarado Pokorski    MR#:  [id number]  DATE OF BIRTH:  03/26/1961  DATE OF ADMISSION:  05/29/2017  PRIMARY CARE PHYSICIAN: Lorre MunroeBaity, Regina W, NP   REQUESTING/REFERRING PHYSICIAN: dr Fanny Bienquale  CHIEF COMPLAINT:   Numbness right hand HISTORY OF PRESENT ILLNESS:  Justin Alvarado  is a 57 y.o. male with no past medical history presented to the emergency room due to numbness in the right hand and weakness. Patient reports that early this morning he dropped his toothbrush and developed right-sided weakness. Wife reports that he came out of the bathroom his speech was abnormal. She noticed some slurred speech No other focal deficits were noted. Patient without facial droop, headache, chest pain or shortness breath.  He also states that this morning when he was getting CT head he had numbness of his left side of the face. All of the symptoms have subsided. PAST MEDICAL HISTORY:  None  PAST SURGICAL HISTORY:   Past Surgical History:  Procedure Laterality Date  . HERNIA REPAIR    . UMBILICAL HERNIA REPAIR  06/2013  . WISDOM TOOTH EXTRACTION      SOCIAL HISTORY:   Social History   Tobacco Use  . Smoking status: Current Some Day Smoker    Types: Cigars  . Smokeless tobacco: Never Used  Substance Use Topics  . Alcohol use: Yes    Alcohol/week: 0.0 oz    Comment: socially    FAMILY HISTORY:   Family History  Problem Relation Age of Onset  . Hyperlipidemia Father   . Esophageal cancer Mother   . Heart disease Maternal Grandmother   . Heart disease Maternal Grandfather   . Heart disease Paternal Grandmother   . Hyperlipidemia Paternal Grandfather   . Heart disease Paternal Grandfather   . Stroke Paternal Grandfather   . Hypertension Paternal Grandfather     DRUG ALLERGIES:  No Known Allergies  REVIEW OF SYSTEMS:   Review of Systems  Constitutional: Negative.  Negative for chills, fever and  malaise/fatigue.  HENT: Negative.  Negative for ear discharge, ear pain, hearing loss, nosebleeds and sore throat.   Eyes: Negative.  Negative for blurred vision and pain.  Respiratory: Negative.  Negative for cough, hemoptysis, shortness of breath and wheezing.   Cardiovascular: Negative.  Negative for chest pain, palpitations and leg swelling.  Gastrointestinal: Negative.  Negative for abdominal pain, blood in stool, diarrhea, nausea and vomiting.  Genitourinary: Negative.  Negative for dysuria.  Musculoskeletal: Negative.  Negative for back pain.  Skin: Negative.   Neurological: Positive for tingling, sensory change, speech change and focal weakness. Negative for dizziness, tremors, seizures and headaches.  Endo/Heme/Allergies: Negative.  Does not bruise/bleed easily.  Psychiatric/Behavioral: Negative.  Negative for depression, hallucinations and suicidal ideas.    MEDICATIONS AT HOME:   Prior to Admission medications   Medication Sig Start Date End Date Taking? Authorizing Provider  HYDROcodone-homatropine (HYCODAN) 5-1.5 MG/5ML syrup Take 5 mLs by mouth every 8 (eight) hours as needed for cough. Patient not taking: Reported on 05/29/2017 09/02/16   Lorre MunroeBaity, Regina W, NP  simvastatin (ZOCOR) 40 MG tablet Take 1 tablet (40 mg total) by mouth at bedtime. Patient not taking: Reported on 05/29/2017 09/13/15   Lorre MunroeBaity, Regina W, NP      VITAL SIGNS:  Blood pressure (!) 148/96, pulse 83, temperature 98.9 F (37.2 C), temperature source Oral, resp. rate (!) 21, weight 86.2 kg (190 lb), SpO2 95 %.  PHYSICAL EXAMINATION:  Physical Exam  Constitutional: He is oriented to person, place, and time and well-developed, well-nourished, and in no distress. No distress.  HENT:  Head: Normocephalic.  Eyes: No scleral icterus.  Neck: Normal range of motion. Neck supple. No JVD present. No tracheal deviation present.  Cardiovascular: Normal rate, regular rhythm and normal heart sounds. Exam reveals no  gallop and no friction rub.  No murmur heard. Pulmonary/Chest: Effort normal and breath sounds normal. No respiratory distress. He has no wheezes. He has no rales. He exhibits no tenderness.  Abdominal: Soft. Bowel sounds are normal. He exhibits no distension and no mass. There is no tenderness. There is no rebound and no guarding.  Musculoskeletal: Normal range of motion. He exhibits no edema.  Neurological: He is alert and oriented to person, place, and time.  Skin: Skin is warm. No rash noted. No erythema.  Psychiatric: Affect and judgment normal.      LABORATORY PANEL:   CBC Recent Labs  Lab 05/29/17 0904  WBC 6.0  HGB 16.7  HCT 49.0  PLT 320   ------------------------------------------------------------------------------------------------------------------  Chemistries  Recent Labs  Lab 05/29/17 0904  NA 135  K 3.9  CL 102  CO2 25  GLUCOSE 127*  BUN 17  CREATININE 1.05  CALCIUM 9.5  AST 35  ALT 30  ALKPHOS 65  BILITOT 0.8   ------------------------------------------------------------------------------------------------------------------  Cardiac Enzymes Recent Labs  Lab 05/29/17 0904  TROPONINI <0.03   ------------------------------------------------------------------------------------------------------------------  RADIOLOGY:  Ct Head Code Stroke Wo Contrast  Result Date: 05/29/2017 CLINICAL DATA:  Code stroke. Episode of arm numbness and slurred speech lasting 2 minutes this morning. EXAM: CT HEAD WITHOUT CONTRAST TECHNIQUE: Contiguous axial images were obtained from the base of the skull through the vertex without intravenous contrast. COMPARISON:  None. FINDINGS: Brain: Normal appearance without evidence of atrophy, old or acute infarction, mass lesion, hemorrhage, hydrocephalus or extra-axial collection. Vascular: No abnormal vascular finding. Skull: Normal Sinuses/Orbits: Clear/normal Other: None ASPECTS (Alberta Stroke Program Early CT Score) -  Ganglionic level infarction (caudate, lentiform nuclei, internal capsule, insula, M1-M3 cortex): 7 - Supraganglionic infarction (M4-M6 cortex): 3 Total score (0-10 with 10 being normal): 10 IMPRESSION: 1. Normal head CT. 2. ASPECTS is 10. 3. These results were called by telephone at the time of interpretation on 05/29/2017 at 9:26 am to Dr. Thad Ranger, who verbally acknowledged these results. Electronically Signed   By: Paulina Fusi M.D.   On: 05/29/2017 09:28    EKG:   Orders placed or performed during the hospital encounter of 05/29/17  . ED EKG  . ED EKG  . EKG 12-Lead  . EKG 12-Lead    IMPRESSION AND PLAN:   57 year old male with no past medical history who presents with numbness in the right hand and slurred speech which have resolved.  1. Numbness of right hand and slurred speech concerning for TIA/CVA CVA workup including MRI brain, echocardiogram and carotid Dopplers Neurology consultation appreciated Continue neuro checks PT, OT and speech consult Check lipid panel and A1c Continue Lipitor and aspirin Continue telemetry monitoring  2. Tobacco dependence: Patient is encouraged to completely quit smoking cigars. Counseling was provided for 4 minutes.   All the records are reviewed and case discussed with ED provider. Management plans discussed with the patient and he is in agreement  CODE STATUS: FULL  TOTAL TIME TAKING CARE OF THIS PATIENT: 45 minutes.    Paradise Vensel M.D on 05/29/2017 at 10:33 AM  Between 7am to 6pm - Pager - [phone]  After  6pm go to [email or web address] - Social research officer, government  [facility]  Office  [phone]  CC: Primary care physician; Lorre Munroe, NP

## 2017-05-29 NOTE — ED Triage Notes (Signed)
Pt states after getting out of the shower this am had an episode of arm numbness and slurred speech, last about two minutes. Pt states some sx have resolved but not completely. Happened about 35 min ago.

## 2017-05-29 NOTE — Code Documentation (Signed)
PT arrives POV from home with his wife, states he got out of the shower about 830 and had right arm tingling and numbness, states it felt like there was an electric current going through him, states it caused him to drop his toothbrush, pt then stated it felt as if his tongue with thick, having some slurred speech, wife states she noticed his speech sounded slurred as well, upon arrival to ED code stroke activated in triage and pt taken to CT then room 18, upon neuro assessment pt states symptoms have resolved, NIHSS 0, no tPA given due to resolved symptoms, see stroke documentation for timeline, report off to [place] RN

## 2017-05-29 NOTE — ED Triage Notes (Signed)
CBG 115 out in triage.

## 2017-05-29 NOTE — Progress Notes (Signed)
*  PRELIMINARY RESULTS* Echocardiogram 2D Echocardiogram has been performed.  Joanette GulaJoan M Bertil Brickey 05/29/2017, 4:04 PM

## 2017-05-29 NOTE — ED Notes (Signed)
MD Reynolds at bedside at this time  

## 2017-05-29 NOTE — Evaluation (Signed)
Physical Therapy Evaluation Patient Details Name: Justin Alvarado MRN: [id number] DOB: 09/11/1960 Today's Date: 05/29/2017   History of Present Illness  Pt admitted s/p R side numbness consistent with a TIA.  No PMH documented.  Clinical Impression  Pt is a 57 year old male who lives in a two story home with his wife.  Pt is able to perform bed mobility, transfers and ambulation independently.  Strength testing reveals 5/5 strength bilat with the exception of knee flexion (4+/5).  Pt reports no sensation deficits and all coordination testing is negative for deficits, though pt did present with a slight tremor in bilat UE during finger to nose test.  PT reviewed coordination exercises with pt and instructed him to practice these at home.  PT emphasized importance of stress management and continuing an active exercise routine. Pt expressed understanding.  Pt will not benefit from skilled PT at this time.    Follow Up Recommendations No PT follow up    Equipment Recommendations       Recommendations for Other Services       Precautions / Restrictions Precautions Precautions: Fall Restrictions Weight Bearing Restrictions: No      Mobility  Bed Mobility Overal bed mobility: Independent                Transfers Overall transfer level: Independent                  Ambulation/Gait Ambulation/Gait assistance: Independent Ambulation Distance (Feet): 240 Feet Assistive device: None Gait Pattern/deviations: WFL(Within Functional Limits)   Gait velocity interpretation: at or above normal speed for age/gender    Stairs            Wheelchair Mobility    Modified Rankin (Stroke Patients Only)       Balance Overall balance assessment: Independent(pt performed higher level dynamic gait: head turns, looking up/down, sudden stop, direction change with no LOB or need to decelerate.  Coordination: finger to nose: B: 5 sec, heel to shin: B: 5 sec., RAM: WNL Slight  UE tremor bilat L>R)                                           Pertinent Vitals/Pain Pain Assessment: No/denies pain    Home Living Family/patient expects to be discharged to:: Private residence Living Arrangements: Spouse/significant other Available Help at Discharge: Family Type of Home: House Home Access: Level entry     Home Layout: Two level   Additional Comments: No need for AD.    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     Hand Dominance   Dominant Hand: Right    Extremity/Trunk Assessment   Upper Extremity Assessment Upper Extremity Assessment: Overall WFL for tasks assessed    Lower Extremity Assessment Lower Extremity Assessment: Overall WFL for tasks assessed(No N/T reported)    Cervical / Trunk Assessment Cervical / Trunk Assessment: Normal  Communication   Communication: No difficulties  Cognition Arousal/Alertness: Awake/alert Behavior During Therapy: WFL for tasks assessed/performed Overall Cognitive Status: Within Functional Limits for tasks assessed                                        General Comments  Exercises     Assessment/Plan    PT Assessment Patent does not need any further PT services  PT Problem List         PT Treatment Interventions      PT Goals (Current goals can be found in the Care Plan section)       Frequency     Barriers to discharge        Co-evaluation               AM-PAC PT "6 Clicks" Daily Activity  Outcome Measure Difficulty turning over in bed (including adjusting bedclothes, sheets and blankets)?: None Difficulty moving from lying on back to sitting on the side of the bed? : None Difficulty sitting down on and standing up from a chair with arms (e.g., wheelchair, bedside commode, etc,.)?: None Help needed moving to and from a bed to chair (including a wheelchair)?: None Help needed  walking in hospital room?: None Help needed climbing 3-5 steps with a railing? : None 6 Click Score: 24    End of Session Equipment Utilized During Treatment: Gait belt Activity Tolerance: Patient tolerated treatment well Patient left: in bed;with call bell/phone within reach;with bed alarm set        Time: 1340-1355 PT Time Calculation (min) (ACUTE ONLY): 15 min   Charges:     PT Treatments $Neuromuscular Re-education: 8-22 mins   PT G Codes:   PT G-Codes **NOT FOR INPATIENT CLASS** Functional Assessment Tool Used: AM-PAC 6 Clicks Basic Mobility    Glenetta HewSarah Tomislav Micale, PT, DPT   Glenetta HewSarah Sianne Tejada 05/29/2017, 2:06 PM

## 2017-05-29 NOTE — Progress Notes (Signed)
SLP Cancellation Note  Patient Details Name: Stanely Sexson MRN: [id number] DOB: 04-20-1961   Cancelled treatment:       Reason Eval/Treat Not Completed: SLP screened, no needs identified, will sign off(chart reviewed; consulted NSG then met w/ pt). Pt denied any difficulty swallowing and is currently on a regular diet; tolerates swallowing pills w/ water per NSG. Pt conversed at conversational level w/out deficits noted; pt denied any speech-language deficits and stated his speech was "normal".  No further skilled ST services indicated as pt appears at his baseline. Pt agreed. NSG to reconsult if any change in status.     Orinda Kenner, MS, CCC-SLP Jentry Mcqueary 05/29/2017, 1:23 PM

## 2017-05-29 NOTE — Consult Note (Addendum)
Referring Physician: Quale    Chief Complaint: Right arm weakness, slurred speech  HPI: Justin Alvarado is an 57 y.o. male with no significant past medical history who presents with complaints of right upper extremity weakness and slurred speech.  The patient reports awakening this morning at baseline.  Was able to take a shower and get dressed.  While brushing his teeth had acute inability to be able to hold the toothbrush.  His right arm became numb and weak.  He then felt as if his tongue was swollen.  When attempting to speak to his wife speech was slurred.  Patient was brought in for evaluation at that time.  At the time of evaluation the patient is a symptoms had resolved.  Initial NIH stroke scale of 0.  Date last known well: Date: 05/29/2017 Time last known well: Time: 08:30 tPA Given: No: Resolution of symptoms  History reviewed. No pertinent past medical history.  Past Surgical History:  Procedure Laterality Date  . HERNIA REPAIR    . UMBILICAL HERNIA REPAIR  06/2013  . WISDOM TOOTH EXTRACTION      Family History  Problem Relation Age of Onset  . Hyperlipidemia Father   . Esophageal cancer Mother   . Heart disease Maternal Grandmother   . Heart disease Maternal Grandfather   . Heart disease Paternal Grandmother   . Hyperlipidemia Paternal Grandfather   . Heart disease Paternal Grandfather   . Stroke Paternal Grandfather   . Hypertension Paternal Grandfather    Social History:  reports that he has been smoking cigars.  he has never used smokeless tobacco. He reports that he drinks alcohol. He reports that he does not use drugs.  Allergies: No Known Allergies  Medications:  I have reviewed the patient's current medications. Prior to Admission:  Medications Prior to Admission  Medication Sig Dispense Refill Last Dose  . HYDROcodone-homatropine (HYCODAN) 5-1.5 MG/5ML syrup Take 5 mLs by mouth every 8 (eight) hours as needed for cough. (Patient not taking: Reported on  05/29/2017) 120 mL 0 Not Taking at Unknown time  . simvastatin (ZOCOR) 40 MG tablet Take 1 tablet (40 mg total) by mouth at bedtime. (Patient not taking: Reported on 05/29/2017) 30 tablet 5 Not Taking at Unknown time   Scheduled: .  stroke: mapping our early stages of recovery book   Does not apply Once  . aspirin  300 mg Rectal Daily   Or  . aspirin  325 mg Oral Daily  . atorvastatin  40 mg Oral q1800  . enoxaparin (LOVENOX) injection  40 mg Subcutaneous Q24H    ROS: History obtained from the patient  General ROS: negative for - chills, fatigue, fever, night sweats, weight gain or weight loss Psychological ROS: negative for - behavioral disorder, hallucinations, memory difficulties, mood swings or suicidal ideation Ophthalmic ROS: negative for - blurry vision, double vision, eye pain or loss of vision ENT ROS: negative for - epistaxis, nasal discharge, oral lesions, sore throat, tinnitus or vertigo Allergy and Immunology ROS: negative for - hives or itchy/watery eyes Hematological and Lymphatic ROS: negative for - bleeding problems, bruising or swollen lymph nodes Endocrine ROS: negative for - galactorrhea, hair pattern changes, polydipsia/polyuria or temperature intolerance Respiratory ROS: negative for - cough, hemoptysis, shortness of breath or wheezing Cardiovascular ROS: negative for - chest pain, dyspnea on exertion, edema or irregular heartbeat Gastrointestinal ROS: negative for - abdominal pain, diarrhea, hematemesis, nausea/vomiting or stool incontinence Genito-Urinary ROS: negative for - dysuria, hematuria, incontinence or urinary frequency/urgency Musculoskeletal  ROS: negative for - joint swelling or muscular weakness Neurological ROS: as noted in HPI Dermatological ROS: negative for rash and skin lesion changes  Physical Examination: Blood pressure (!) 159/90, pulse 92, temperature 98.9 F (37.2 C), temperature source Oral, resp. rate 20, weight 86.2 kg (190 lb), SpO2 97  %.  HEENT-  Normocephalic, no lesions, without obvious abnormality.  Normal external eye and conjunctiva.  Normal TM's bilaterally.  Normal auditory canals and external ears. Normal external nose, mucus membranes and septum.  Normal pharynx. Cardiovascular- S1, S2 normal, pulses palpable throughout   Lungs- chest clear, no wheezing, rales, normal symmetric air entry Abdomen- soft, non-tender; bowel sounds normal; no masses,  no organomegaly Extremities- no edema Lymph-no adenopathy palpable Musculoskeletal-no joint tenderness, deformity or swelling Skin-warm and dry, no hyperpigmentation, vitiligo, or suspicious lesions  Neurological Examination   Mental Status: Alert, oriented, thought content appropriate.  Speech fluent without evidence of aphasia.  Able to follow 3 step commands without difficulty. Cranial Nerves: II: Discs flat bilaterally; Visual fields grossly normal, pupils equal, round, reactive to light and accommodation III,IV, VI: ptosis not present, extra-ocular motions intact bilaterally V,VII: smile symmetric, facial light touch sensation normal bilaterally VIII: hearing normal bilaterally IX,X: gag reflex present XI: bilateral shoulder shrug XII: midline tongue extension Motor: Right : Upper extremity   5/5    Left:     Upper extremity   5/5  Lower extremity   5/5     Lower extremity   5/5 Tone and bulk:normal tone throughout; no atrophy noted Sensory: Pinprick and light touch intact throughout, bilaterally Deep Tendon Reflexes: 2+ and symmetric throughout Plantars: Right: downgoing   Left: downgoing Cerebellar: Normal finger-to-nose and normal heel-to-shin testing bilaterally Gait: normal gait and station    Laboratory Studies:  Basic Metabolic Panel: No results for input(s): NA, K, CL, CO2, GLUCOSE, BUN, CREATININE, CALCIUM, MG, PHOS in the last 168 hours.  Liver Function Tests: No results for input(s): AST, ALT, ALKPHOS, BILITOT, PROT, ALBUMIN in the last 168  hours. No results for input(s): LIPASE, AMYLASE in the last 168 hours. No results for input(s): AMMONIA in the last 168 hours.  CBC: Recent Labs  Lab 05/29/17 0904  WBC 6.0  NEUTROABS 3.7  HGB 16.7  HCT 49.0  MCV 87.1  PLT 320    Cardiac Enzymes: No results for input(s): CKTOTAL, CKMB, CKMBINDEX, TROPONINI in the last 168 hours.  BNP: Invalid input(s): POCBNP  CBG: No results for input(s): GLUCAP in the last 168 hours.  Microbiology: No results found for this or any previous visit.  Coagulation Studies: No results for input(s): LABPROT, INR in the last 72 hours.  Urinalysis: No results for input(s): COLORURINE, LABSPEC, PHURINE, GLUCOSEU, HGBUR, BILIRUBINUR, KETONESUR, PROTEINUR, UROBILINOGEN, NITRITE, LEUKOCYTESUR in the last 168 hours.  Invalid input(s): APPERANCEUR  Lipid Panel:    Component Value Date/Time   CHOL 252 (H) 09/08/2015 0813   TRIG 108.0 09/08/2015 0813   HDL 45.80 09/08/2015 0813   CHOLHDL 6 09/08/2015 0813   VLDL 21.6 09/08/2015 0813   LDLCALC 185 (H) 09/08/2015 0813    HgbA1C:  Lab Results  Component Value Date   HGBA1C 6.1 09/08/2015    Urine Drug Screen:  No results found for: LABOPIA, COCAINSCRNUR, LABBENZ, AMPHETMU, THCU, LABBARB  Alcohol Level: No results for input(s): ETH in the last 168 hours.  Other results: EKG: 96 bpm, sinus  Imaging: No results found.  Assessment: 57 y.o. male with acute onset right upper extremity numbness and weakness and  slurred speech.  Symptoms have resolved.  Patient with minimal risk factors for stroke.  Head CT reviewed and shows no acute changes.  Cannot rule out TIA as etiology of symptoms.  Patient not a candidate of TPA due to unresolving symptoms.  Patient to be admitted for TIA/stroke workup.  Patient on no antiplatelet therapy at home.  Stroke Risk Factors - smoking  Plan: 1. HgbA1c, fasting lipid panel 2. MRI, MRA  of the brain without contrast 3. PT consult, OT consult, Speech  consult 4. Echocardiogram 5. Carotid dopplers 6. Prophylactic therapy-Antiplatelet med: Aspirin - dose 325mg  daily 7. NPO until RN stroke swallow screen 8. Telemetry monitoring 9. Frequent neuro checks 10. Smoking cessation counseling 11. EEG    Thana Farr, MD Neurology [phone] 05/29/2017, 9:30 AM

## 2017-05-30 DIAGNOSIS — G459 Transient cerebral ischemic attack, unspecified: Secondary | ICD-10-CM | POA: Diagnosis not present

## 2017-05-30 LAB — LIPID PANEL
CHOL/HDL RATIO: 6.3 ratio
CHOLESTEROL: 282 mg/dL — AB (ref 0–200)
HDL: 45 mg/dL (ref >40)
LDL Cholesterol: 191 mg/dL — ABNORMAL HIGH (ref 0–99)
Triglycerides: 228 mg/dL — ABNORMAL HIGH (ref <150)
VLDL: 46 mg/dL — AB (ref 0–40)

## 2017-05-30 LAB — HEMOGLOBIN A1C
Hgb A1c MFr Bld: 5.9 % — ABNORMAL HIGH (ref 4.8–5.6)
MEAN PLASMA GLUCOSE: 122.63 mg/dL

## 2017-05-30 LAB — GLUCOSE, CAPILLARY: GLUCOSE-CAPILLARY: 115 mg/dL — AB (ref 65–99)

## 2017-05-30 MED ORDER — ASPIRIN 325 MG PO TBEC: 325.0000 mg | DELAYED_RELEASE_TABLET | Freq: Every day | ORAL | 0 refills | Status: AC

## 2017-05-30 MED ORDER — ASPIRIN EC 325 MG PO TBEC
325.0000 mg | DELAYED_RELEASE_TABLET | Freq: Every day | ORAL | Status: DC
  Administered 2017-05-30: 325 mg via ORAL
  Filled 2017-05-30: qty 1

## 2017-05-30 MED ORDER — ATORVASTATIN CALCIUM 40 MG PO TABS: 40.0000 mg | ORAL_TABLET | Freq: Every day | ORAL | 0 refills | Status: AC

## 2017-05-30 NOTE — Discharge Summary (Signed)
[facility] [place] [facility]   PATIENT NAME: Justin Alvarado    MR#:  [id number]  DATE OF BIRTH:  Dec 22, 1960  DATE OF ADMISSION:  05/29/2017 ADMITTING PHYSICIAN: Delfino Lovett, MD  DATE OF DISCHARGE: 05/30/17  PRIMARY CARE PHYSICIAN: Jerl Mina, MD    ADMISSION DIAGNOSIS:  TIA (transient ischemic attack) [G45.9]  DISCHARGE DIAGNOSIS:  Active Problems:   TIA (transient ischemic attack)  HYPERLIPIDEMIA SECONDARY DIAGNOSIS:  History reviewed. No pertinent past medical history.  HOSPITAL COURSE:   HPI Justin Alvarado  is a 57 y.o. male with no past medical history presented to the emergency room due to numbness in the right hand and weakness. Patient reports that early this morning he dropped his toothbrush and developed right-sided weakness. Wife reports that he came out of the bathroom his speech was abnormal. She noticed some slurred speech No other focal deficits were noted. Patient without facial droop, headache, chest pain or shortness breath.  He also states that this morning when he was getting CT head he had numbness of his left side of the face. All of the symptoms have subsided.  1. Numbness of right hand and slurred speech concerning for TIA/CVA CVA workup including MRI brain and carotid Dopplers are negative Echocardiogram has revealed 60-65% ejection fraction Neurology consultation appreciated.  Recommending to discharge patient with aspirin 325 mg and statin and outpatient neurology follow-up Continued neuro checks-during the hospital course patient is a symptomatic, PT, OT and speech consult-no needs identified lipid panel  with LDL 191 Continue Lipitor and aspirin. Patient was supposed to take Zocor at home but he stopped taking that without discussing with his primary care physician.  Reinforced the importance of being compliant with the medications.  Patient verbalized understanding  2. Tobacco dependence: Patient is  encouraged to completely quit smoking cigars. Counseling was provided      DISCHARGE CONDITIONS:   stable  CONSULTS OBTAINED:  Treatment Team:  Kym Groom, MD Thana Farr, MD   PROCEDURES  None   DRUG ALLERGIES:  No Known Allergies  DISCHARGE MEDICATIONS:   Allergies as of 05/30/2017   No Known Allergies     Medication List    STOP taking these medications   HYDROcodone-homatropine 5-1.5 MG/5ML syrup Commonly known as:  HYCODAN   simvastatin 40 MG tablet Commonly known as:  ZOCOR     TAKE these medications   aspirin 325 MG EC tablet Take 1 tablet (325 mg total) by mouth daily. Start taking on:  05/31/2017   atorvastatin 40 MG tablet Commonly known as:  LIPITOR Take 1 tablet (40 mg total) by mouth daily at 6 PM.        DISCHARGE INSTRUCTIONS:   Follow-up with primary care physician in a week Follow-up with neurology in 4-6 weeks  DIET:  Cardiac diet  DISCHARGE CONDITION:  Stable  ACTIVITY:  Activity as tolerated  OXYGEN:  Home Oxygen: No.   Oxygen Delivery: room air  DISCHARGE LOCATION:  home   If you experience worsening of your admission symptoms, develop shortness of breath, life threatening emergency, suicidal or homicidal thoughts you must seek medical attention immediately by calling 911 or calling your MD immediately  if symptoms less severe.  You Must read complete instructions/literature along with all the possible adverse reactions/side effects for all the Medicines you take and that have been prescribed to you. Take any new Medicines after you have completely understood and accpet all the possible adverse reactions/side effects.   Please note  You were cared for by a hospitalist during your hospital stay. If you have any questions about your discharge medications or the care you received while you were in the hospital after you are discharged, you can call the unit and asked to speak with the hospitalist on call if the  hospitalist that took care of you is not available. Once you are discharged, your primary care physician will handle any further medical issues. Please note that NO REFILLS for any discharge medications will be authorized once you are discharged, as it is imperative that you return to your primary care physician (or establish a relationship with a primary care physician if you do not have one) for your aftercare needs so that they can reassess your need for medications and monitor your lab values.     Today  Chief Complaint  Patient presents with  . Code Stroke  . Numbness  . Aphasia   Patient denies any headache or blurry vision.  Denies any complaints.  No weakness in his extremities.  Doing fine and wants to go home.  Okay to discharge patient from neurology standpoint  ROS:  CONSTITUTIONAL: Denies fevers, chills. Denies any fatigue, weakness.  EYES: Denies blurry vision, double vision, eye pain. EARS, NOSE, THROAT: Denies tinnitus, ear pain, hearing loss. RESPIRATORY: Denies cough, wheeze, shortness of breath.  CARDIOVASCULAR: Denies chest pain, palpitations, edema.  GASTROINTESTINAL: Denies nausea, vomiting, diarrhea, abdominal pain. Denies bright red blood per rectum. GENITOURINARY: Denies dysuria, hematuria. ENDOCRINE: Denies nocturia or thyroid problems. HEMATOLOGIC AND LYMPHATIC: Denies easy bruising or bleeding. SKIN: Denies rash or lesion. MUSCULOSKELETAL: Denies pain in neck, back, shoulder, knees, hips or arthritic symptoms.  NEUROLOGIC: Denies paralysis, paresthesias.  PSYCHIATRIC: Denies anxiety or depressive symptoms.   VITAL SIGNS:  Blood pressure 117/81, pulse 72, temperature 97.7 F (36.5 C), temperature source Oral, resp. rate 18, height 5\' 9"  (1.753 m), weight 87.8 kg (193 lb 9 oz), SpO2 95 %.  I/O:    Intake/Output Summary (Last 24 hours) at 05/30/2017 1304 Last data filed at 05/30/2017 0945 Gross per 24 hour  Intake 1555 ml  Output -  Net 1555 ml     PHYSICAL EXAMINATION:  GENERAL:  57 y.o.-year-old patient lying in the bed with no acute distress.  EYES: Pupils equal, round, reactive to light and accommodation. No scleral icterus. Extraocular muscles intact.  HEENT: Head atraumatic, normocephalic. Oropharynx and nasopharynx clear.  NECK:  Supple, no jugular venous distention. No thyroid enlargement, no tenderness.  LUNGS: Normal breath sounds bilaterally, no wheezing, rales,rhonchi or crepitation. No use of accessory muscles of respiration.  CARDIOVASCULAR: S1, S2 normal. No murmurs, rubs, or gallops.  ABDOMEN: Soft, non-tender, non-distended. Bowel sounds present. No organomegaly or mass.  EXTREMITIES: No pedal edema, cyanosis, or clubbing.  NEUROLOGIC: Cranial nerves II through XII are intact. Muscle strength 5/5 in all extremities. Sensation intact. Gait not checked.  PSYCHIATRIC: The patient is alert and oriented x 3.  SKIN: No obvious rash, lesion, or ulcer.   DATA REVIEW:   CBC Recent Labs  Lab 05/29/17 0904  WBC 6.0  HGB 16.7  HCT 49.0  PLT 320    Chemistries  Recent Labs  Lab 05/29/17 0904  NA 135  K 3.9  CL 102  CO2 25  GLUCOSE 127*  BUN 17  CREATININE 1.05  CALCIUM 9.5  AST 35  ALT 30  ALKPHOS 65  BILITOT 0.8    Cardiac Enzymes Recent Labs  Lab 05/29/17 0904  TROPONINI <0.03  Microbiology Results  No results found for this or any previous visit.  RADIOLOGY:  Mr Brain Wo Contrast  Result Date: 05/29/2017 CLINICAL DATA:  TIA, right handed weakness EXAM: MRI HEAD WITHOUT CONTRAST TECHNIQUE: Multiplanar, multiecho pulse sequences of the brain and surrounding structures were obtained without intravenous contrast. COMPARISON:  CT 05/29/2017 FINDINGS: Brain: Ventricle size and cerebral volume normal. Negative for acute infarct. Solitary small hyperintensity in the right posterior frontal lobe. Brainstem and cerebellum normal. Negative for hemorrhage or mass. No fluid collection or midline shift  Vascular: Negative Skull and upper cervical spine: Negative Sinuses/Orbits: Negative Other: None IMPRESSION: Negative MRI head without contrast. Electronically Signed   By: Marlan Palauharles  Clark M.D.   On: 05/29/2017 11:57   [place] Carotid Bilateral (at [facility] And [facility] Only)  Result Date: 05/29/2017 CLINICAL DATA:  TIA.  Hyperlipidemia. EXAM: BILATERAL CAROTID DUPLEX ULTRASOUND TECHNIQUE: Wallace CullensGray scale imaging, color Doppler and duplex ultrasound was performed of bilateral carotid and vertebral arteries in the neck. COMPARISON:  None. TECHNIQUE: Quantification of carotid stenosis is based on velocity parameters that correlate the residual internal carotid diameter with NASCET-based stenosis levels, using the diameter of the distal internal carotid lumen as the denominator for stenosis measurement. The following velocity measurements were obtained: PEAK SYSTOLIC/END DIASTOLIC RIGHT ICA:                     82/22cm/sec CCA:                     91/28cm/sec SYSTOLIC ICA/CCA RATIO:  0.9 DIASTOLIC ICA/CCA RATIO: 0.8 ECA:                     66cm/sec LEFT ICA:                     77/25cm/sec CCA:                     90/23cm/sec SYSTOLIC ICA/CCA RATIO:  0.9 DIASTOLIC ICA/CCA RATIO: 1.1 ECA:                     82cm/sec FINDINGS: RIGHT CAROTID ARTERY: No significant plaque or stenosis. Normal waveforms and color Doppler signal. RIGHT VERTEBRAL ARTERY:  Normal flow direction and waveform. LEFT CAROTID ARTERY: No significant plaque or stenosis. Normal waveforms and color Doppler signal. LEFT VERTEBRAL ARTERY: Normal flow direction and waveform. IMPRESSION: 1. No significant carotid plaque or stenosis. 2.  Antegrade bilateral vertebral arterial flow. Electronically Signed   By: Corlis Leak  Hassell M.D.   On: 05/29/2017 12:52   Ct Head Code Stroke Wo Contrast  Result Date: 05/29/2017 CLINICAL DATA:  Code stroke. Episode of arm numbness and slurred speech lasting 2 minutes this morning. EXAM: CT HEAD WITHOUT CONTRAST TECHNIQUE: Contiguous axial  images were obtained from the base of the skull through the vertex without intravenous contrast. COMPARISON:  None. FINDINGS: Brain: Normal appearance without evidence of atrophy, old or acute infarction, mass lesion, hemorrhage, hydrocephalus or extra-axial collection. Vascular: No abnormal vascular finding. Skull: Normal Sinuses/Orbits: Clear/normal Other: None ASPECTS (Alberta Stroke Program Early CT Score) - Ganglionic level infarction (caudate, lentiform nuclei, internal capsule, insula, M1-M3 cortex): 7 - Supraganglionic infarction (M4-M6 cortex): 3 Total score (0-10 with 10 being normal): 10 IMPRESSION: 1. Normal head CT. 2. ASPECTS is 10. 3. These results were called by telephone at the time of interpretation on 05/29/2017 at 9:26 am to Dr. Thad Rangereynolds, who verbally acknowledged these results. Electronically Signed  By: Paulina Fusi M.D.   On: 05/29/2017 09:28    EKG:   Orders placed or performed during the hospital encounter of 05/29/17  . ED EKG  . ED EKG  . EKG 12-Lead  . EKG 12-Lead      Management plans discussed with the patient, family and they are in agreement.  CODE STATUS:     Code Status Orders  (From admission, onward)        Start     Ordered   05/29/17 1036  Full code  Continuous     05/29/17 1035    Code Status History    Date Active Date Inactive Code Status Order ID Comments User Context   This patient has a current code status but no historical code status.      TOTAL TIME TAKING CARE OF THIS PATIENT:  43  minutes.   Note: This dictation was prepared with Dragon dictation along with smaller phrase technology. Any transcriptional errors that result from this process are unintentional.   @[facility] @  on 05/30/2017 at 1:04 PM  Between 7am to 6pm - Pager - [phone]  After 6pm go to [email or web address] - password [email or web address] [facility]  [place] [facility]  Office  [phone]  CC: Primary care physician; Jerl Mina, MD

## 2017-05-30 NOTE — Progress Notes (Signed)
Patient discharged home per MD orders. Prescriptions given to patient. All discharge instructions given and all questions answered. 

## 2017-05-30 NOTE — Discharge Instructions (Signed)
Follow-up with primary care physician in a week Follow-up with neurology in 4-6 weeks

## 2017-05-30 NOTE — Progress Notes (Signed)
Subjective: Patient reports being at baseline today.  No new neurological complaints.  Objective: Current vital signs: BP 117/81 (BP Location: Right Arm)   Pulse 72   Temp 97.7 F (36.5 C) (Oral)   Resp 18   Ht 5\' 9"  (1.753 m)   Wt 87.8 kg (193 lb 9 oz)   SpO2 95%   BMI 28.58 kg/m  Vital signs in last 24 hours: Temp:  [97.7 F (36.5 C)-98.4 F (36.9 C)] 97.7 F (36.5 C) (01/25 0631) Pulse Rate:  [72-81] 72 (01/25 0631) Resp:  [18-20] 18 (01/25 0631) BP: (106-134)/(62-93) 117/81 (01/25 0631) SpO2:  [94 %-97 %] 95 % (01/25 0631)  Intake/Output from previous day: 01/24 0701 - 01/25 0700 In: 1195 [P.O.:240; I.V.:955] Out: -  Intake/Output this shift: Total I/O In: 360 [P.O.:360] Out: -  Nutritional status: Fall precautions Diet Heart Room service appropriate? Yes; Fluid consistency: Thin  Neurologic Exam: Mental Status: Alert, oriented, thought content appropriate.  Speech fluent without evidence of aphasia.  Able to follow 3 step commands without difficulty. Cranial Nerves: II: Discs flat bilaterally; Visual fields grossly normal, pupils equal, round, reactive to light and accommodation III,IV, VI: ptosis not present, extra-ocular motions intact bilaterally V,VII: smile symmetric, facial light touch sensation normal bilaterally VIII: hearing normal bilaterally IX,X: gag reflex present XI: bilateral shoulder shrug XII: midline tongue extension Motor: Right :  Upper extremity   5/5                                      Left:     Upper extremity   5/5             Lower extremity   5/5                                                  Lower extremity   5/5   Lab Results: Basic Metabolic Panel: Recent Labs  Lab 05/29/17 0904  NA 135  K 3.9  CL 102  CO2 25  GLUCOSE 127*  BUN 17  CREATININE 1.05  CALCIUM 9.5    Liver Function Tests: Recent Labs  Lab 05/29/17 0904  AST 35  ALT 30  ALKPHOS 65  BILITOT 0.8  PROT 8.3*  ALBUMIN 4.7   No results for  input(s): LIPASE, AMYLASE in the last 168 hours. No results for input(s): AMMONIA in the last 168 hours.  CBC: Recent Labs  Lab 05/29/17 0904  WBC 6.0  NEUTROABS 3.7  HGB 16.7  HCT 49.0  MCV 87.1  PLT 320    Cardiac Enzymes: Recent Labs  Lab 05/29/17 0904  TROPONINI <0.03    Lipid Panel: Recent Labs  Lab 05/29/17 0904 05/30/17 0448  CHOL 306* 282*  TRIG 129 228*  HDL 59 45  CHOLHDL 5.2 6.3  VLDL 26 46*  LDLCALC 221* 191*    CBG: No results for input(s): GLUCAP in the last 168 hours.  Microbiology: No results found for this or any previous visit.  Coagulation Studies: Recent Labs    05/29/17 0904  LABPROT 12.3  INR 0.92    Imaging: Mr Brain Wo Contrast  Result Date: 05/29/2017 CLINICAL DATA:  TIA, right handed weakness EXAM: MRI HEAD WITHOUT CONTRAST TECHNIQUE: Multiplanar, multiecho pulse sequences of the brain  and surrounding structures were obtained without intravenous contrast. COMPARISON:  CT 05/29/2017 FINDINGS: Brain: Ventricle size and cerebral volume normal. Negative for acute infarct. Solitary small hyperintensity in the right posterior frontal lobe. Brainstem and cerebellum normal. Negative for hemorrhage or mass. No fluid collection or midline shift Vascular: Negative Skull and upper cervical spine: Negative Sinuses/Orbits: Negative Other: None IMPRESSION: Negative MRI head without contrast. Electronically Signed   By: Marlan Palau M.D.   On: 05/29/2017 11:57   US Carotid Bilateral (at [facility] Only)  Result Date: 05/29/2017 CLINICAL DATA:  TIA.  Hyperlipidemia. EXAM: BILATERAL CAROTID DUPLEX ULTRASOUND TECHNIQUE: Wallace Cullens scale imaging, color Doppler and duplex ultrasound was performed of bilateral carotid and vertebral arteries in the neck. COMPARISON:  None. TECHNIQUE: Quantification of carotid stenosis is based on velocity parameters that correlate the residual internal carotid diameter with NASCET-based stenosis levels, using the diameter of  the distal internal carotid lumen as the denominator for stenosis measurement. The following velocity measurements were obtained: PEAK SYSTOLIC/END DIASTOLIC RIGHT ICA:                     82/22cm/sec CCA:                     91/28cm/sec SYSTOLIC ICA/CCA RATIO:  0.9 DIASTOLIC ICA/CCA RATIO: 0.8 ECA:                     66cm/sec LEFT ICA:                     77/25cm/sec CCA:                     90/23cm/sec SYSTOLIC ICA/CCA RATIO:  0.9 DIASTOLIC ICA/CCA RATIO: 1.1 ECA:                     82cm/sec FINDINGS: RIGHT CAROTID ARTERY: No significant plaque or stenosis. Normal waveforms and color Doppler signal. RIGHT VERTEBRAL ARTERY:  Normal flow direction and waveform. LEFT CAROTID ARTERY: No significant plaque or stenosis. Normal waveforms and color Doppler signal. LEFT VERTEBRAL ARTERY: Normal flow direction and waveform. IMPRESSION: 1. No significant carotid plaque or stenosis. 2.  Antegrade bilateral vertebral arterial flow. Electronically Signed   By: Corlis Leak M.D.   On: 05/29/2017 12:52   Ct Head Code Stroke Wo Contrast  Result Date: 05/29/2017 CLINICAL DATA:  Code stroke. Episode of arm numbness and slurred speech lasting 2 minutes this morning. EXAM: CT HEAD WITHOUT CONTRAST TECHNIQUE: Contiguous axial images were obtained from the base of the skull through the vertex without intravenous contrast. COMPARISON:  None. FINDINGS: Brain: Normal appearance without evidence of atrophy, old or acute infarction, mass lesion, hemorrhage, hydrocephalus or extra-axial collection. Vascular: No abnormal vascular finding. Skull: Normal Sinuses/Orbits: Clear/normal Other: None ASPECTS (Alberta Stroke Program Early CT Score) - Ganglionic level infarction (caudate, lentiform nuclei, internal capsule, insula, M1-M3 cortex): 7 - Supraganglionic infarction (M4-M6 cortex): 3 Total score (0-10 with 10 being normal): 10 IMPRESSION: 1. Normal head CT. 2. ASPECTS is 10. 3. These results were called by telephone at the time of  interpretation on 05/29/2017 at 9:26 am to Dr. Thad Ranger, who verbally acknowledged these results. Electronically Signed   By: Paulina Fusi M.D.   On: 05/29/2017 09:28    Medications:  I have reviewed the patient's current medications. Scheduled: . aspirin EC  325 mg Oral Daily  . aspirin  300 mg Rectal Daily  .  atorvastatin  40 mg Oral q1800  . enoxaparin (LOVENOX) injection  40 mg Subcutaneous Q24H    Assessment/Plan: No new neurological complaints.  Carotid dopplers show no evidence of hemodynamically significant stenosis.  Echocardiogram shows no cardiac source of emboli with an EF of 60-65%.  A1c 5.9, LDL 191.  Recommendations: 1. Continue ASA 2. Statin for lipid management with target LDL<70. 3.  Follow with neurology on an outpatient basis.   LOS: 0 days   Thana FarrLeslie Dabria Wadas, MD Neurology [phone] 05/30/2017  11:32 AM

## 2017-05-31 LAB — HIV ANTIBODY (ROUTINE TESTING W REFLEX): HIV SCREEN 4TH GENERATION: NONREACTIVE

## 2017-07-18 ENCOUNTER — Ambulatory Visit: Payer: Federal, State, Local not specified - PPO | Attending: Neurology

## 2017-07-18 DIAGNOSIS — G4733 Obstructive sleep apnea (adult) (pediatric): Secondary | ICD-10-CM | POA: Diagnosis present

## 2018-02-06 IMAGING — US US CAROTID DUPLEX BILAT
1 series · 13 of 24 positions shown · non-contrast
Comparison: None.

CLINICAL DATA: TIA.  Hyperlipidemia.

EXAM:
BILATERAL CAROTID DUPLEX ULTRASOUND
TECHNIQUE: Gray scale imaging, color Doppler and duplex ultrasound was
performed of bilateral carotid and vertebral arteries in the neck.
TECHNIQUE: Quantification of carotid stenosis is based on velocity parameters
that correlate the residual internal carotid diameter with
NASCET-based stenosis levels, using the diameter of the distal
internal carotid lumen as the denominator for stenosis measurement.

[Series 1: us carotid duplex bilat · 13 of 64 slices shown]
[im 1/64]
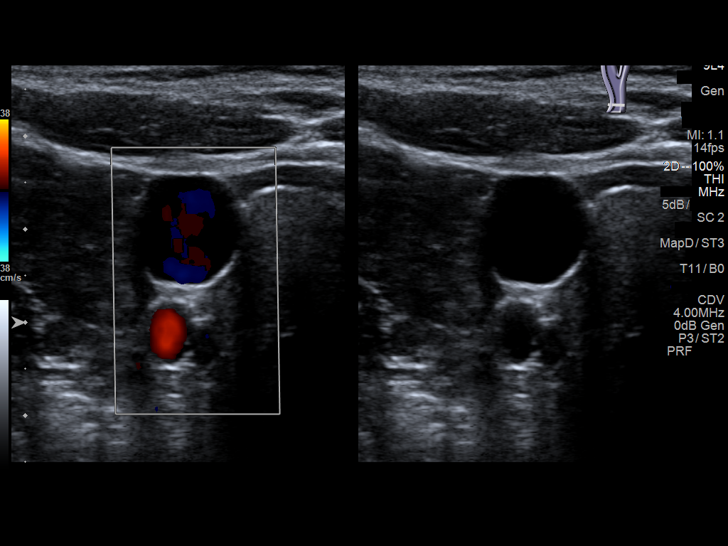
[im 6/64]
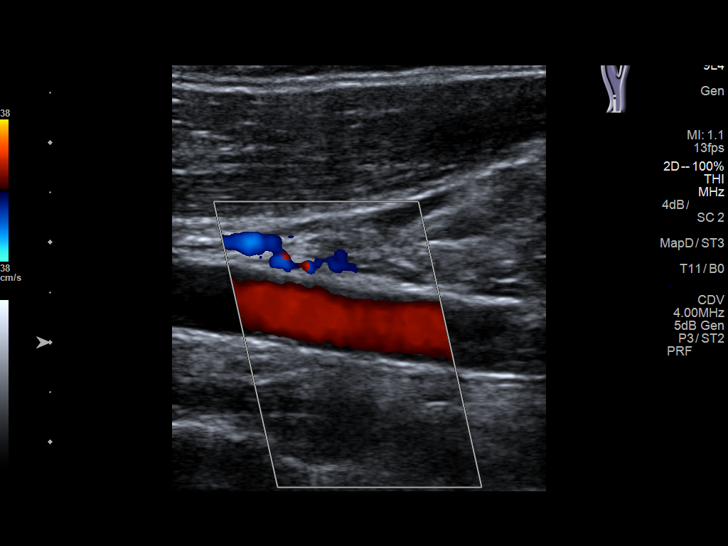
[im 11/64]
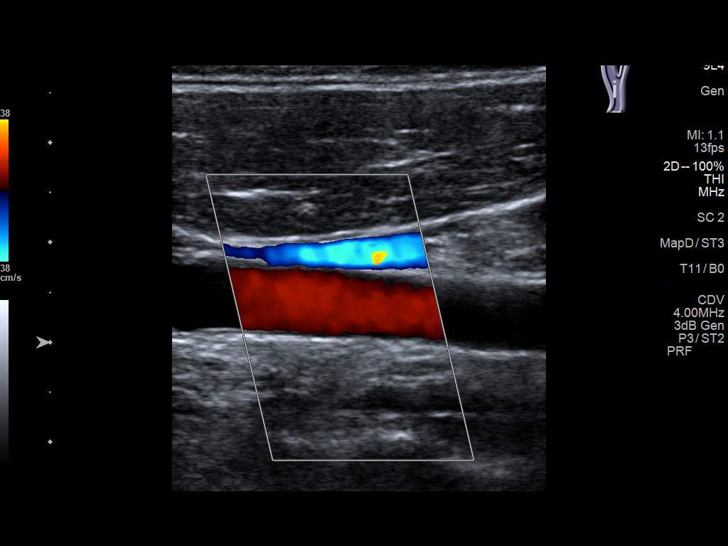
[im 17/64]
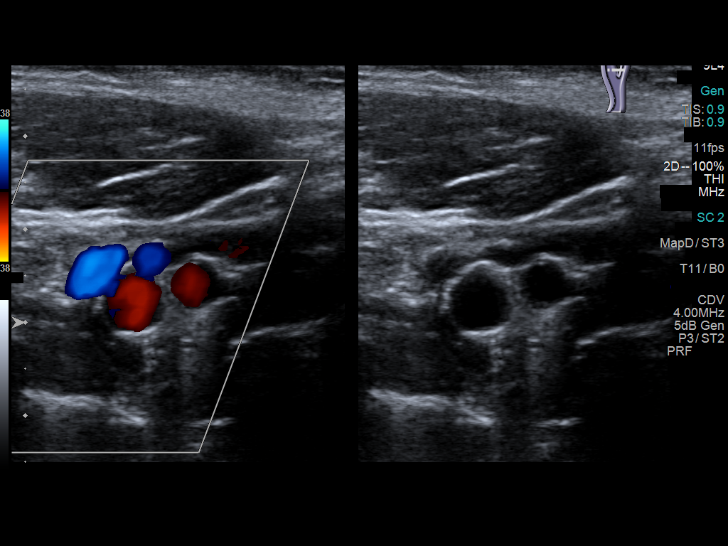
[im 22/64]
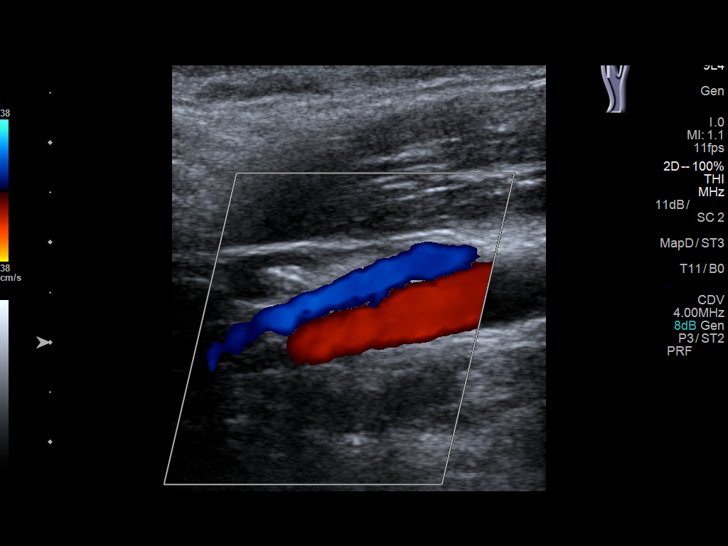
[im 28/64]
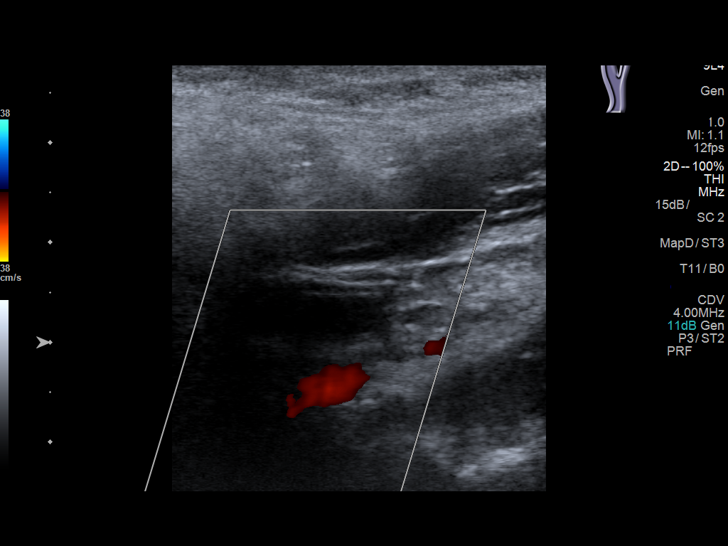
[im 33/64]
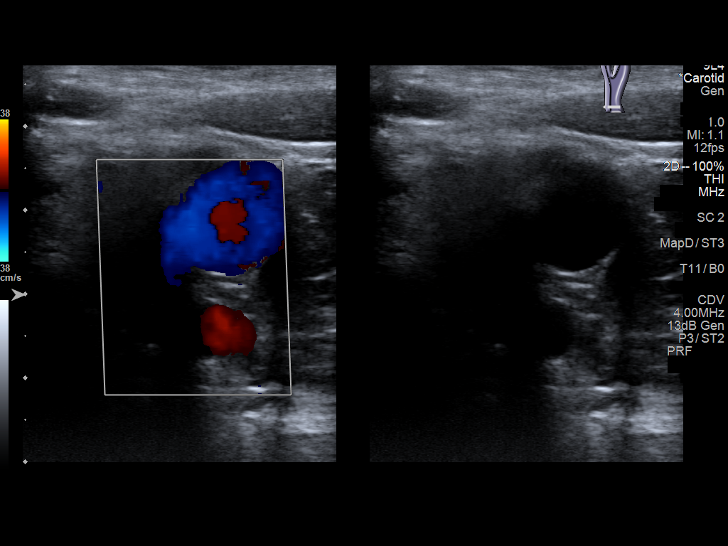
[im 36/64]
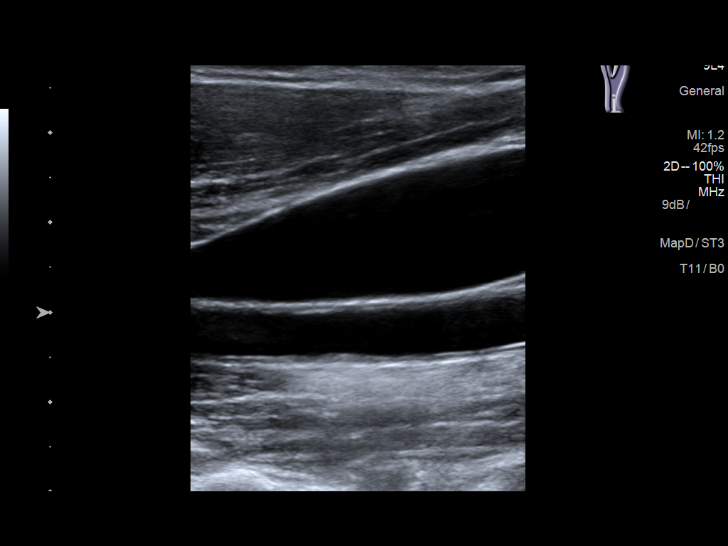
[im 42/64]
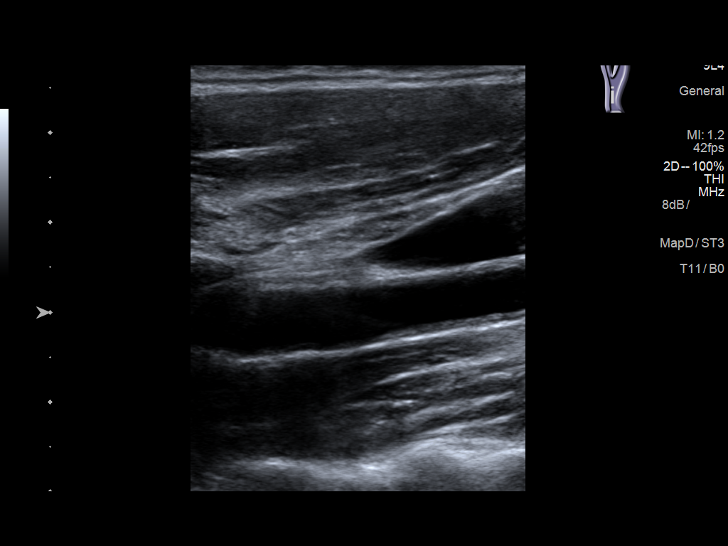
[im 47/64]
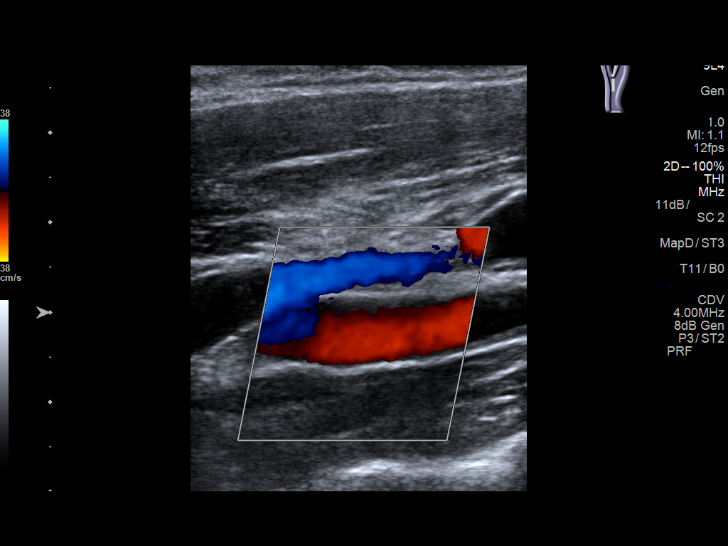
[im 53/64]
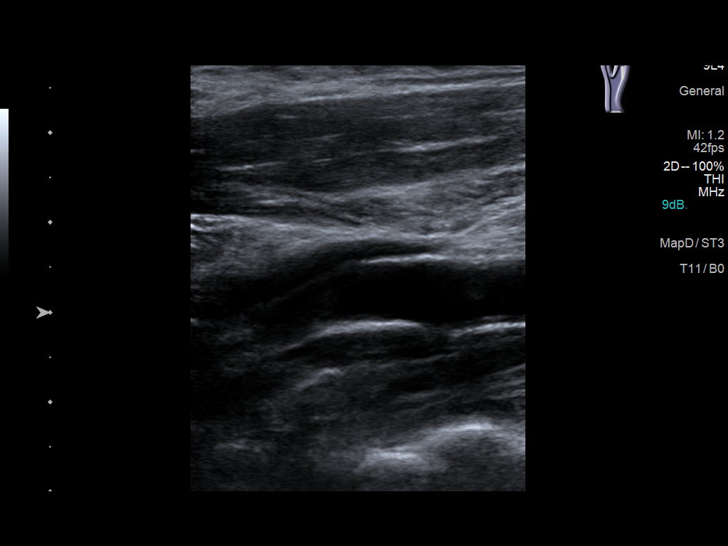
[im 58/64]
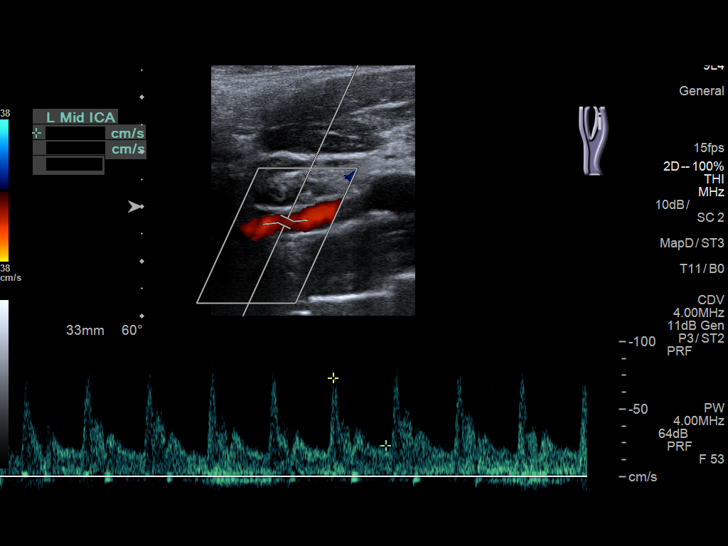
[im 64/64]
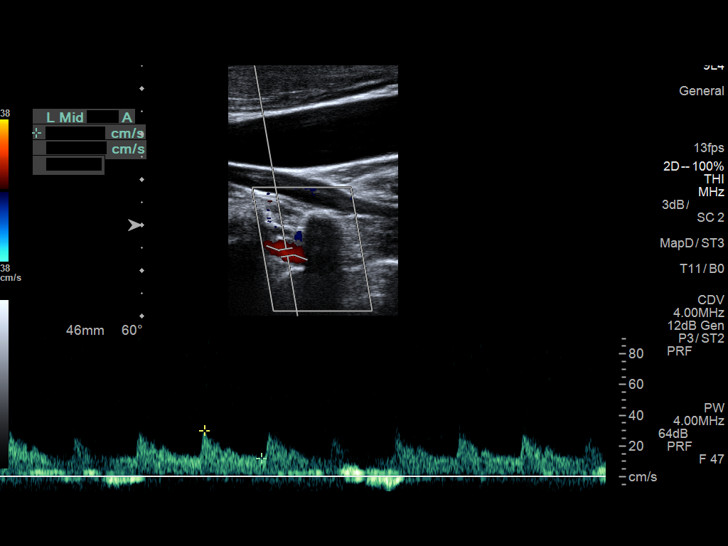

[13 of 24 positions shown; findings below may reference images not displayed]

The following velocity measurements were obtained:

PEAK SYSTOLIC/END DIASTOLIC

RIGHT

ICA:                     82/22cm/sec

CCA:                     91/28cm/sec

SYSTOLIC ICA/CCA RATIO:

DIASTOLIC ICA/CCA RATIO:

ECA:                     66cm/sec

LEFT

ICA:                     77/25cm/sec

CCA:                     90/23cm/sec

SYSTOLIC ICA/CCA RATIO:

DIASTOLIC ICA/CCA RATIO:

ECA:                     82cm/sec
FINDINGS: RIGHT CAROTID ARTERY: No significant plaque or stenosis. Normal
waveforms and color Doppler signal.

RIGHT VERTEBRAL ARTERY:  Normal flow direction and waveform.

LEFT CAROTID ARTERY: No significant plaque or stenosis. Normal
waveforms and color Doppler signal.

LEFT VERTEBRAL ARTERY: Normal flow direction and waveform.
IMPRESSION: 1. No significant carotid plaque or stenosis.
2.  Antegrade bilateral vertebral arterial flow.
